# Patient Record
Sex: Female | Born: 1990 | Hispanic: Yes | Smoking: Never smoker
Health system: Southern US, Community
[De-identification: ages and names within clinical notes are randomized; demographics above are authoritative.]

## PROBLEM LIST (undated history)

## (undated) ENCOUNTER — Inpatient Hospital Stay (HOSPITAL_COMMUNITY): Payer: Self-pay

## (undated) DIAGNOSIS — O24419 Gestational diabetes mellitus in pregnancy, unspecified control: Secondary | ICD-10-CM

## (undated) DIAGNOSIS — Z789 Other specified health status: Secondary | ICD-10-CM

## (undated) DIAGNOSIS — K2970 Gastritis, unspecified, without bleeding: Secondary | ICD-10-CM

## (undated) HISTORY — PX: NO PAST SURGERIES: SHX2092

## (undated) HISTORY — DX: Other specified health status: Z78.9

---

## 2014-05-19 ENCOUNTER — Other Ambulatory Visit (HOSPITAL_COMMUNITY): Payer: Self-pay | Admitting: Urology

## 2014-05-19 DIAGNOSIS — Z3689 Encounter for other specified antenatal screening: Secondary | ICD-10-CM

## 2014-05-19 LAB — OB RESULTS CONSOLE VARICELLA ZOSTER ANTIBODY, IGG: Varicella: IMMUNE

## 2014-05-19 LAB — OB RESULTS CONSOLE ANTIBODY SCREEN: Antibody Screen: NEGATIVE

## 2014-05-19 LAB — OB RESULTS CONSOLE GC/CHLAMYDIA
CHLAMYDIA, DNA PROBE: NEGATIVE
GC PROBE AMP, GENITAL: NEGATIVE

## 2014-05-19 LAB — OB RESULTS CONSOLE PLATELET COUNT: PLATELETS: 241 10*3/uL

## 2014-05-19 LAB — OB RESULTS CONSOLE RPR
RPR: NONREACTIVE
RPR: NONREACTIVE

## 2014-05-19 LAB — OB RESULTS CONSOLE HEPATITIS B SURFACE ANTIGEN: Hepatitis B Surface Ag: NEGATIVE

## 2014-05-19 LAB — OB RESULTS CONSOLE HGB/HCT, BLOOD
HCT: 38 %
Hemoglobin: 12.6 g/dL

## 2014-05-19 LAB — OB RESULTS CONSOLE ABO/RH: RH Type: POSITIVE

## 2014-05-19 LAB — SICKLE CELL SCREEN: SICKLE CELL SCREEN: NEGATIVE

## 2014-05-19 LAB — GLUCOSE TOLERANCE, 1 HOUR: Glucose, GTT - 1 Hour: 89 mg/dL (ref ?–200)

## 2014-05-19 LAB — CYSTIC FIBROSIS DIAGNOSTIC STUDY: Interpretation-CFDNA:: NEGATIVE

## 2014-05-19 LAB — CYTOLOGY - PAP: Pap: NEGATIVE

## 2014-05-19 LAB — CULTURE, OB URINE: Urine Culture, OB: NEGATIVE

## 2014-05-19 LAB — OB RESULTS CONSOLE RUBELLA ANTIBODY, IGM: Rubella: IMMUNE

## 2014-05-19 LAB — OB RESULTS CONSOLE HIV ANTIBODY (ROUTINE TESTING): HIV: NONREACTIVE

## 2014-06-08 ENCOUNTER — Ambulatory Visit (HOSPITAL_COMMUNITY)
Admission: RE | Admit: 2014-06-08 | Discharge: 2014-06-08 | Disposition: A | Discharge status: Home / Self Care | Payer: Medicaid Other | Source: Ambulatory Visit | Attending: Urology | Admitting: Urology

## 2014-06-08 DIAGNOSIS — Z3689 Encounter for other specified antenatal screening: Secondary | ICD-10-CM | POA: Diagnosis present

## 2014-08-11 LAB — GLUCOSE TOLERANCE, 1 HOUR: Glucose, GTT - 1 Hour: 139 mg/dL (ref ?–200)

## 2014-08-11 LAB — OB RESULTS CONSOLE HGB/HCT, BLOOD
HCT: 34 %
HEMOGLOBIN: 11.1 g/dL

## 2014-08-18 LAB — GLUCOSE TOLERANCE, 3 HOURS
GLUCOSE 3 HOUR GTT: 169 mg/dL — AB (ref ?–140)
Glucose, GTT - 1 Hour: 143 mg/dL (ref ?–200)
Glucose, GTT - 2 Hour: 171 mg/dL — AB (ref ?–140)
Glucose, GTT - Fasting: 75 mg/dL — AB (ref 80–110)

## 2014-08-22 ENCOUNTER — Ambulatory Visit: Payer: Self-pay | Admitting: *Deleted

## 2014-08-22 ENCOUNTER — Encounter: Payer: Medicaid Other | Attending: Family Medicine | Admitting: *Deleted

## 2014-08-22 VITALS — Ht 61.75 in | Wt 154.6 lb

## 2014-08-22 DIAGNOSIS — E119 Type 2 diabetes mellitus without complications: Secondary | ICD-10-CM

## 2014-08-22 DIAGNOSIS — O24419 Gestational diabetes mellitus in pregnancy, unspecified control: Secondary | ICD-10-CM | POA: Insufficient documentation

## 2014-08-22 DIAGNOSIS — Z713 Dietary counseling and surveillance: Secondary | ICD-10-CM | POA: Insufficient documentation

## 2014-08-22 NOTE — Progress Notes (Signed)
  Patient was seen on 08/22/14 for Gestational Diabetes self-management . The following learning objectives were met by the patient :   States the definition of Gestational Diabetes  States when to check blood glucose levels  Demonstrates proper blood glucose monitoring techniques  States the effect of stress and exercise on blood glucose levels  Plan:  Consider  increasing your activity level by walking daily as tolerated Begin checking BG before breakfast and 1-2 hours after first bit of breakfast, lunch and dinner after  as directed by MD  Take medication  as directed by MD  Blood glucose monitor given: [employer] Lot # [id number] Exp: 2016/05/29  Patient instructed to monitor glucose levels: FBS: 60 - <90 2 hour: <120  Patient received the following handouts:  Nutrition Diabetes and Pregnancy (Spanish)  Patient will be seen for follow-up as needed.

## 2014-08-22 NOTE — Progress Notes (Signed)
Nutrition note: 1st visit consult & GDM diet education Pt is a newly diagnosed GDM pt. Pt has gained 9.6# @ 3875w5d, which is slightly < expected. Pt reports eating 3 meals & 2-4 snacks/d. Pt is taking a PNV. Pt reports no N/V or heartburn. NKFA Pt reports walking for 1-2 hrs/d. Pt received verbal & written education about GDM diet in Spanish via an interpreter.  Discussed wt gain goals of 15-25# or 0.6#/wk. Pt agrees to follow GDM diet with 3 meals & 3 snacks/d with proper CHO/ protein combination/d. Pt has WIC & plans to BF. F/u in 4-6 wks Blondell RevealLaura Chasey Dull, MS, RD, LDN, [facility]

## 2014-08-29 ENCOUNTER — Ambulatory Visit (INDEPENDENT_AMBULATORY_CARE_PROVIDER_SITE_OTHER): Payer: Medicaid Other | Admitting: Family Medicine

## 2014-08-29 ENCOUNTER — Encounter: Payer: Self-pay | Admitting: *Deleted

## 2014-08-29 ENCOUNTER — Encounter: Payer: Self-pay | Admitting: Family Medicine

## 2014-08-29 VITALS — BP 104/70 | HR 70 | Temp 97.7°F | Wt 154.3 lb

## 2014-08-29 DIAGNOSIS — O24419 Gestational diabetes mellitus in pregnancy, unspecified control: Secondary | ICD-10-CM

## 2014-08-29 DIAGNOSIS — O0993 Supervision of high risk pregnancy, unspecified, third trimester: Secondary | ICD-10-CM | POA: Insufficient documentation

## 2014-08-29 HISTORY — DX: Gestational diabetes mellitus in pregnancy, unspecified control: O24.419

## 2014-08-29 LAB — POCT URINALYSIS DIP (DEVICE)
BILIRUBIN URINE: NEGATIVE
Glucose, UA: NEGATIVE mg/dL
Hgb urine dipstick: NEGATIVE
KETONES UR: NEGATIVE mg/dL
NITRITE: NEGATIVE
Protein, ur: NEGATIVE mg/dL
Specific Gravity, Urine: 1.02 (ref 1.005–1.030)
Urobilinogen, UA: 0.2 mg/dL (ref 0.0–1.0)
pH: 5.5 (ref 5.0–8.0)

## 2014-08-29 MED ORDER — GLYBURIDE 2.5 MG PO TABS: 2.5000 mg | ORAL_TABLET | Freq: Every day | ORAL | Status: DC

## 2014-08-29 NOTE — Progress Notes (Signed)
Fasting CBG  6 out of 7 > 90 (87-115) 2 hr PP 6:11 elevated  Start glyburide 2.5mg  at night F/u 1 week

## 2014-08-29 NOTE — Progress Notes (Signed)
Alis used for interpreter  

## 2014-08-29 NOTE — Patient Instructions (Signed)
Tercer trimestre del embarazo  (Third Trimester of Pregnancy)  El tercer trimestre del embarazo abarca desde la semana 29 hasta la semana 42, desde el 7 mes hasta el 9. En este trimestre el beb (feto) se desarrolla muy rpidamente. Hacia el final del noveno mes, el beb que an no ha nacido mide alrededor de 20 pulgadas (45 cm) de largo. Y pesa entre 6 y 10 libras (2,700 y 4,500 kg).  CUIDADOS EN EL HOGAR   Evite fumar, consumir hierbas y beber alcohol. Evite los frmacos que no apruebe el mdico.  Slo tome los medicamentos que le haya indicado su mdico. Algunos medicamentos son seguros para tomar durante el embarazo y otros no lo son.  Haga ejercicios slo como le indique el mdico. Deje de hacer ejercicios si comienza a tener clicos.  Haga comidas regulares y sanas.  Use un sostn que le brinde buen soporte si sus mamas estn sensibles.  No utilice la baera con agua caliente, baos turcos y saunas.  Colquese el cinturn de seguridad cuando conduzca.  Evite comer carne cruda y el contacto con los utensilios y desperdicios de los gatos.  Tome las vitaminas indicadas para la etapa prenatal.  Trate de tomar medicamentos para mover el intestino (laxantes) segn lo necesario y si su mdico la autoriza. Consuma ms fibra comiendo frutas y vegetales frescos y granos enteros. Beba gran cantidad de lquido para mantener el pis (orina) de tono claro o amarillo plido.  Tome baos de agua tibia (baos de asiento) para calmar el dolor o las molestias causadas por las hemorroides. Use una crema para las hemorroides si el mdico la autoriza.  Si tiene venas hinchadas y abultadas (venas varicosas), use medias de soporte. Eleve (levante) los pies durante 15 minutos, 3 o 4 veces por da. Limite el consumo de sal en su dieta.  Evite levantar objetos pesados, usar tacones altos y sintese derecha.  Descanse con las piernas elevadas si tiene calambres o dolor de cintura.  Visite a su dentista  si no lo ha hecho durante el embarazo. Use un cepillo de dientes blando para higienizarse los dientes. Use suavemente el hilo dental.  Puede tener sexo (relaciones sexuales) siempre que el mdico la autorice.  No viaje por largas distancias si puede evitarlo. Slo hgalo con la aprobacin de su mdico.  Haga el curso pre parto.  Practique conducir hasta el hospital.  Prepare el bolso que llevar.  Prepare la habitacin del beb.  Concurra a los controles mdicos. SOLICITE AYUDA SI:   No est segura si est en trabajo de parto o ha roto la bolsa de aguas.  Tiene mareos.  Siente clicos intensos o presin en la zona baja del vientre (abdomen).  Siente un dolor persistente en la zona del vientre.  Tiene malestar estomacal (nuseas), devuelve (vomita), o tiene deposiciones acuosas (diarrea).  Advierte un olor ftido que proviene de la vagina.  Siente dolor al hacer pis (orinar). SOLICITE AYUDA DE INMEDIATO SI:   Tiene fiebre.  Pierde lquido o sangre por la vagina.  Tiene sangrando o pequeas prdidas vaginales.  Siente dolor intenso o clicos en el abdomen.  Sube o baja de peso rpidamente.  Tiene dificultad para respirar o siente dolor en el pecho.  Sbitamente se le hinchan el rostro, las manos, los tobillos, los pies o las piernas.  No ha sentido los movimientos del beb durante una hora.  Siente un dolor de cabeza intenso que no se alivia con medicamentos.  Su visin se modifica. Document   Released: 04/28/2013 [employer] Patient Information 2015 [employer]. This information is not intended to replace advice given to you by your health care provider. Make sure you discuss any questions you have with your health care provider.  

## 2014-09-05 ENCOUNTER — Encounter: Payer: Self-pay | Admitting: Family Medicine

## 2014-09-05 ENCOUNTER — Ambulatory Visit (INDEPENDENT_AMBULATORY_CARE_PROVIDER_SITE_OTHER): Payer: Self-pay | Admitting: Family Medicine

## 2014-09-05 VITALS — BP 102/63 | HR 65 | Wt 151.8 lb

## 2014-09-05 DIAGNOSIS — O24419 Gestational diabetes mellitus in pregnancy, unspecified control: Secondary | ICD-10-CM

## 2014-09-05 DIAGNOSIS — O0993 Supervision of high risk pregnancy, unspecified, third trimester: Secondary | ICD-10-CM

## 2014-09-05 LAB — POCT URINALYSIS DIP (DEVICE)
BILIRUBIN URINE: NEGATIVE
Glucose, UA: NEGATIVE mg/dL
HGB URINE DIPSTICK: NEGATIVE
Ketones, ur: NEGATIVE mg/dL
NITRITE: NEGATIVE
PH: 6 (ref 5.0–8.0)
PROTEIN: NEGATIVE mg/dL
Specific Gravity, Urine: 1.015 (ref 1.005–1.030)
Urobilinogen, UA: 0.2 mg/dL (ref 0.0–1.0)

## 2014-09-05 MED ORDER — GLYBURIDE 5 MG PO TABS: 5.0000 mg | ORAL_TABLET | Freq: Every day | ORAL | Status: DC

## 2014-09-05 NOTE — Progress Notes (Signed)
Spanish interpreter: Okey Regalarol used FBS 85-119 (still with fastings are out of range) 2 hour pp 86-123 (1 out of range) Increase nighttime Glyburide to 5 mg

## 2014-09-05 NOTE — Patient Instructions (Signed)
Diabetes mellitus gestacional (Gestational Diabetes Mellitus) La diabetes mellitus gestacional, ms comnmente conocida como diabetes gestacional es un tipo de diabetes que desarrollan algunas mujeres durante el embarazo. En la diabetes gestacional, el pncreas no produce suficiente insulina (una hormona) o las clulas son menos sensibles a la insulina producida (resistencia a la insulina), o ambas cosas. Normalmente, la insulina mueve los azcares de los alimentos a las clulas de los tejidos. Las clulas de los tejidos utilizan los azcares para obtener energa. La falta de insulina o la falta de una respuesta normal a la insulina hace que el exceso de azcar se acumule en la sangre en lugar de penetrar en las clulas de los tejidos. Como resultado, se producen niveles altos de azcar en la sangre (hiperglucemia). El efecto de los niveles altos de azcar (glucosa) puede causar muchos problemas.  FACTORES DE RIESGO Usted tiene mayor probabilidad de desarrollar diabetes gestacional si tiene antecedentes familiares de diabetes y tambin si tiene uno o ms de los siguientes factores de riesgo:  ndice de masa corporal superior a 30 (obesidad).  Embarazo previo con diabetes gestacional.  La edad avanzada en el momento del embarazo. Si se mantienen los niveles de glucosa en la sangre en un rango normal durante el embarazo, las mujeres pueden tener un embarazo saludable. Si los niveles de glucosa en la sangre no estn bien controlados, puede haber riesgos para usted, el feto o el recin nacido, o durante el trabajo de parto y el parto.  SNTOMAS  Si se presentan sntomas, stos son similares a los sntomas que normalmente experimentar durante el embarazo. Los sntomas de la diabetes gestacional son:   Aumento de la sed (polidipsia).  Aumento de la miccin (poliuria).  Orina con ms frecuencia durante la noche (nocturia).  Prdida de peso. La prdida de peso puede ser muy rpida.  Infecciones  frecuentes y recurrentes.  Cansancio (fatiga).  Debilidad.  Cambios en la visin, como visin borrosa.  Olor a fruta en el aliento.  Dolor abdominal. DIAGNSTICO La diabetes se diagnostica cuando hay aumento de los niveles de glucosa en la sangre. El nivel de glucosa en la sangre puede controlarse en uno o ms de los siguientes anlisis de sangre:  Medicin de glucosa en la sangre en ayunas. No se le permitir comer durante al menos 8 horas antes de que se tome una muestra de sangre.  Pruebas al azar de glucosa en la sangre. El nivel de glucosa en la sangre se controla en cualquier momento del da sin importar el momento en que haya comido.  Prueba de A1c (hemoglobina glucosilada) Una prueba de A1c proporciona informacin sobre el control de la glucosa en la sangre durante los ltimos 3 meses.  Prueba de tolerancia a la glucosa oral (PTGO). La glucosa en la sangre se mide despus de no haber comido (ayunas) durante una a tres horas y despus de beber una bebida que contenga glucosa. Dado que las hormonas que causan la resistencia a la insulina son ms altas alrededor de las semanas 24 a 28 de embarazo, generalmente se realiza una PTGO durante ese tiempo. Si tiene factores de riesgo de diabetes gestacional, su mdico puede hacerle estudios de deteccin antes de las 24semanas de embarazo. TRATAMIENTO   Usted tendr que tomar medicamentos para la diabetes o insulina diariamente para mantener los niveles de glucosa en la sangre en el rango deseado.  Usted tendr que combinar la dosis de insulina con la actividad fsica y la eleccin de alimentos saludables. El objetivo del   tratamiento es mantener el nivel de azcar en la sangre previo a comer (preprandial) y durante la noche entre 60 y 99mg/dl, durante todo el embarazo. El objetivo del tratamiento es mantener el nivel pico de azcar en la sangre despus de comer (glucosa posprandial) entre 100y 140mg/dl. INSTRUCCIONES PARA EL CUIDADO EN EL  HOGAR   Controle su nivel de hemoglobina A1c dos veces al ao.  Contrlese a diario el nivel de glucosa en la sangre segn las indicaciones de su mdico. Es comn realizar controles frecuentes de la glucosa en la sangre.  Supervise las cetonas en la orina cuando est enferma y segn las indicaciones de su mdico.  Tome el medicamento para la diabetes y adminstrese insulina segn las indicaciones de su mdico para mantener el nivel de glucosa en la sangre en el rango deseado.  Nunca se quede sin medicamento para la diabetes o sin insulina. Es necesario que la reciba todos los das.  Ajuste la insulina segn la ingesta de hidratos de carbono. Los hidratos de carbono pueden aumentar los niveles de glucosa en la sangre, pero deben incluirse en su dieta. Los hidratos de carbono aportan vitaminas, minerales y fibra que son una parte esencial de una dieta saludable. Los hidratos de carbono se encuentran en frutas, verduras, cereales integrales, productos lcteos, legumbres y alimentos que contienen azcares aadidos.  Consuma alimentos saludables. Alterne 3 comidas con 3 colaciones.  Aumente de peso saludablemente. El aumento del peso total vara de acuerdo con el ndice de masa corporal que tena antes del embarazo ([facility]).  Lleve una tarjeta de alerta mdica o use una pulsera o medalla de alerta mdica.  Lleve con usted una colacin de 15gramos de hidratos de carbono en todo momento para controlar los niveles bajos de glucosa en la sangre (hipoglucemia). Algunos ejemplos de colaciones de 15gramos de hidratos de carbono son los siguientes:  Tabletas de glucosa, 3 o 4.  Gel de glucosa, tubo de 15 gramos.  Pasas de uva, 2 cucharadas (24 g).  Caramelos de goma, 6.  Galletas de animales, 8.  Jugo de fruta, gaseosa comn, o leche descremada, 4 onzas (120 ml).  Pastillas de goma, 9.  Reconocer la hipoglucemia. Durante el embarazo la hipoglucemia se produce cuando hay niveles de glucosa en la  sangre de 60 mg/dl o menos. El riesgo de hipoglucemia aumenta durante el ayuno o cuando se saltea las comidas, durante o despus de realizar ejercicio intenso y mientras duerme. Los sntomas de hipoglucemia son:  Temblores o sacudidas.  Disminucin de la capacidad de concentracin.  Sudoracin.  Aumento de la frecuencia cardaca.  Dolor de cabeza.  Sequedad en la boca.  Hambre.  Irritabilidad.  Ansiedad.  Sueo agitado.  Alteracin del habla o de la coordinacin.  Confusin.  Tratar la hipoglucemia rpidamente. Si usted est alerta y puede tragar con seguridad, siga la regla de 15/15 que consiste en:  Tome entre 15 y 20gramos de glucosa de accin rpida o carbohidratos. Las opciones de accin rpida son un gel de glucosa, tabletas de glucosa, o 4 onzas (120 ml) de jugo de frutas, gaseosa comn, o leche baja en grasa.  Compruebe su nivel de glucosa en la sangre 15 minutos despus de tomar la glucosa.  Tome entre 15 y 20 gramos ms de glucosa si el nivel de glucosa en la sangre todava es de 70mg/dl o inferior.  Ingiera una comida o una colacin en el lapso de 1 hora una vez que los niveles de glucosa en la sangre vuelven   a la normalidad.  Est atento a la poliuria (miccin excesiva) y la polidipsia (sensacin de mucha sed), que son los primeros signos de la hiperglucemia. El reconocimiento temprano de la hiperglucemia permite un tratamiento oportuno. Trate la hiperglucemia segn le indic su mdico.  Haga actividad fsica por lo menos 30minutos al da o como lo indique su mdico. Se recomienda que 30 minutos despus de cada comida, realice diez minutos de actividad fsica para controlar los niveles de glucosa postprandial en la sangre.  Ajuste su dosis de insulina y la ingesta de alimentos, segn sea necesario, si inicia un nuevo ejercicio o deporte.  Siga su plan para los das de enfermedad cuando no pueda comer o beber como de costumbre.  Evite el tabaco y el  alcohol.  Concurra a todas las visitas de control como se lo haya indicado el mdico.  Siga el consejo del mdico respecto a los controles prenatales y posteriores al parto (postparto), las visitas, la planificacin de las comidas, el ejercicio, los medicamentos, las vitaminas, los anlisis de sangre, otras pruebas mdicas y actividades fsicas.  Realice diariamente el cuidado de la piel y de los pies. Examine su piel y los pies diariamente para ver si tiene cortes, moretones, enrojecimiento, problemas en las uas, sangrado, ampollas o llagas.  Cepllese los dientes y encas por lo menos dos veces al da y use hilo dental al menos una vez por da. Concurra regularmente a las visitas de control con el dentista.  Programe un examen de vista durante el primer trimestre de su embarazo o como lo indique su mdico.  Comparta su plan de control de diabetes en el trabajo o en la escuela.  Mantngase al da con las vacunas.  Aprenda a manejar el estrs.  Obtenga la mayor cantidad posible de informacin sobre la diabetes y solicite ayuda siempre que sea necesario.  Obtenga informacin sobre el amamantamiento y analice esta posibilidad.  Debe controlar el nivel de azcar en la sangre de 6a 12semanas despus del parto. Esto se hace con una prueba de tolerancia a la glucosa oral (PTGO). SOLICITE ATENCIN MDICA SI:   No puede comer alimentos o beber por ms de 6 horas.  Tuvo nuseas o ha vomitado durante ms de 6 horas.  Tiene un nivel de glucosa en la sangre de 200 mg/dl y cetonas en la orina.  Presenta algn cambio en el estado mental.  Desarrolla problemas de visin.  Sufre un dolor persistente de cabeza.  Siente dolor o molestias en la parte superior del abdomen.  Desarrolla una enfermedad grave adicional.  Tuvo diarrea durante ms de 6 horas.  Ha estado enfermo o ha tenido fiebre durante un par de das y no mejora. SOLICITE ATENCIN MDICA DE INMEDIATO SI:   Tiene dificultad  para respirar.  Ya no siente los movimientos del beb.  Est sangrando o tiene flujo vaginal.  Comienza a tener contracciones o trabajo de parto prematuro. ASEGRESE DE QUE:  Comprende estas instrucciones.  Controlar su afeccin.  Recibir ayuda de inmediato si no mejora o si empeora. Document Released: 06/05/2005 Document Revised: 01/10/2014 [employer] Patient Information 2015 [employer]. This information is not intended to replace advice given to you by your health care provider. Make sure you discuss any questions you have with your health care provider.  Lactancia materna (Breastfeeding) Decidir amamantar es una de las mejores elecciones que puede hacer por usted y su beb. El cambio hormonal durante el embarazo produce el desarrollo del tejido mamario y aumenta la cantidad   y el tamao de los conductos galactforos. Estas hormonas tambin permiten que las protenas, los azcares y las grasas de la sangre produzcan la leche materna en las glndulas productoras de leche. Las hormonas impiden que la leche materna sea liberada antes del nacimiento del beb, adems de impulsar el flujo de leche luego del nacimiento. Una vez que ha comenzado a amamantar, pensar en el beb, as como la succin o el llanto, pueden estimular la liberacin de leche de las glndulas productoras de leche.  LOS BENEFICIOS DE AMAMANTAR Para el beb  La primera leche (calostro) ayuda a mejorar el funcionamiento del sistema digestivo del beb.  La leche tiene anticuerpos que ayudan a prevenir las infecciones en el beb.  El beb tiene una menor incidencia de asma, alergias y del sndrome de muerte sbita del lactante.  Los nutrientes en la leche materna son mejores para el beb que la leche maternizada y estn preparados exclusivamente para cubrir las necesidades del beb.  La leche materna mejora el desarrollo cerebral del beb.  Es menos probable que el beb desarrolle otras enfermedades, como obesidad  infantil, asma o diabetes mellitus de tipo 2. Para usted   La lactancia materna favorece el desarrollo de un vnculo muy especial entre la madre y el beb.  Es conveniente. La leche materna siempre est disponible a la temperatura correcta y es econmica.  La lactancia materna ayuda a quemar caloras y a perder el peso ganado durante el embarazo.  Favorece la contraccin del tero al tamao que tena antes del embarazo de manera ms rpida y disminuye el sangrado (loquios) despus del parto.  La lactancia materna contribuye a reducir el riesgo de desarrollar diabetes mellitus de tipo 2, osteoporosis o cncer de mama o de ovario en el futuro. SIGNOS DE QUE EL BEB EST HAMBRIENTO Primeros signos de hambre  Aumenta su estado de alerta o actividad.  Se estira.  Mueve la cabeza de un lado a otro.  Mueve la cabeza y abre la boca cuando se le toca la mejilla o la comisura de la boca (reflejo de bsqueda).  Aumenta las vocalizaciones, tales como sonidos de succin, se relame los labios, emite arrullos, suspiros, o chirridos.  Mueve la mano hacia la boca.  Se chupa con ganas los dedos o las manos. Signos tardos de hambre  Est agitado.  Llora de manera intermitente. Signos de hambre extrema Los signos de hambre extrema requerirn que lo calme y lo consuele antes de que el beb pueda alimentarse adecuadamente. No espere a que se manifiesten los siguientes signos de hambre extrema para comenzar a amamantar:   Agitacin.  Llanto intenso y fuerte.   Gritos. INFORMACIN BSICA SOBRE LA LACTANCIA MATERNA Iniciacin de la lactancia materna  Encuentre un lugar cmodo para sentarse o acostarse, con un buen respaldo para el cuello y la espalda.  Coloque una almohada o una manta enrollada debajo del beb para acomodarlo a la altura de la mama (si est sentada). Las almohadas para amamantar se han diseado especialmente a fin de servir de apoyo para los brazos y el beb mientras  amamanta.  Asegrese de que el abdomen del beb est frente al suyo.  Masajee suavemente la mama. Con las yemas de los dedos, masajee la pared del pecho hacia el pezn en un movimiento circular. Esto estimula el flujo de leche. Es posible que deba continuar este movimiento mientras amamanta si la leche fluye lentamente.  Sostenga la mama con el pulgar por arriba del pezn y los otros   4 dedos por debajo de la mama. Asegrese de que los dedos se encuentren lejos del pezn y de la boca del beb.  Empuje suavemente los labios del beb con el pezn o con el dedo.  Cuando la boca del beb se abra lo suficiente, acrquelo rpidamente a la mama e introduzca todo el pezn y la zona oscura que lo rodea (areola), tanto como sea posible, dentro de la boca del beb.  Debe haber ms areola visible por arriba del labio superior del beb que por debajo del labio inferior.  La lengua del beb debe estar entre la enca inferior y la mama.  Asegrese de que la boca del beb est en la posicin correcta alrededor del pezn (prendida). Los labios del beb deben crear un sello sobre la mama y estar doblados hacia afuera (invertidos).  Es comn que el beb succione durante 2 a 3 minutos para que comience el flujo de leche materna. Cmo debe prenderse Es muy importante que le ensee al beb cmo prenderse adecuadamente a la mama. Si el beb no se prende adecuadamente, puede causarle dolor en el pezn y reducir la produccin de leche materna, y hacer que el beb tenga un escaso aumento de peso. Adems, si el beb no se prende adecuadamente al pezn, puede tragar aire durante la alimentacin. Esto puede causarle molestias al beb. Hacer eructar al beb al cambiar de mama puede ayudarlo a liberar el aire. Sin embargo, ensearle al beb cmo prenderse a la mama adecuadamente es la mejor manera de evitar que se sienta molesto por tragar aire mientras se alimenta. Signos de que el beb se ha prendido adecuadamente al pezn:    Tironea o succiona de modo silencioso, sin causarle dolor.  Se escucha que traga cada 3 o 4 succiones.   Hay movimientos musculares por arriba y por delante de sus odos al succionar. Signos de que el beb no se ha prendido adecuadamente al pezn:   Hace ruidos de succin o de chasquido mientras se alimenta.  Siente dolor en el pezn. Si cree que el beb no se prendi correctamente, deslice el dedo en la comisura de la boca y colquelo entre las encas del beb para interrumpir la succin. Intente comenzar a amamantar nuevamente. Signos de lactancia materna exitosa Signos del beb:   Disminuye gradualmente el nmero de succiones o cesa la succin por completo.  Se duerme.  Relaja el cuerpo.  Retiene una pequea cantidad de leche en la boca.  Se desprende solo del pecho. Signos que presenta usted:  Las mamas han aumentado la firmeza, el peso y el tamao 1 a 3 horas despus de amamantar.  Estn ms blandas inmediatamente despus de amamantar.  Un aumento del volumen de leche, y tambin un cambio en su consistencia y color se producen hacia el quinto da de lactancia materna.  Los pezones no duelen, ni estn agrietados ni sangran. Signos de que su beb recibe la cantidad de leche suficiente  Moja al menos 3 paales en 24 horas. La orina debe ser clara y de color amarillo plido a los 5 das de vida.  Defeca al menos 3 veces en 24 horas a los 5 das de vida. La materia fecal debe ser blanda y amarillenta.  Defeca al menos 3 veces en 24 horas a los 7 das de vida. La materia fecal debe ser grumosa y amarillenta.  No registra una prdida de peso mayor del 10% del peso al nacer durante los primeros 3 das de vida.    Aumenta de peso un promedio de 4 a 7onzas (113 a 198g) por semana despus de los 4 das de vida.  Aumenta de peso, diariamente, de manera uniforme a partir de los 5 das de vida, sin registrar prdida de peso despus de las 2semanas de vida. Despus de  alimentarse, es posible que el beb regurgite una pequea cantidad. Esto es frecuente. FRECUENCIA Y DURACIN DE LA LACTANCIA MATERNA El amamantamiento frecuente la ayudar a producir ms leche y a prevenir problemas de dolor en los pezones e hinchazn en las mamas. Alimente al beb cuando muestre signos de hambre o si siente la necesidad de reducir la congestin de las mamas. Esto se denomina "lactancia a demanda". Evite el uso del chupete mientras trabaja para establecer la lactancia (las primeras 4 a 6 semanas despus del nacimiento del beb). Despus de este perodo, podr ofrecerle un chupete. Las investigaciones demostraron que el uso del chupete durante el primer ao de vida del beb disminuye el riesgo de desarrollar el sndrome de muerte sbita del lactante (SMSL). Permita que el nio se alimente en cada mama todo lo que desee. Contine amamantando al beb hasta que haya terminado de alimentarse. Cuando el beb se desprende o se queda dormido mientras se est alimentando de la primera mama, ofrzcale la segunda. Debido a que, con frecuencia, los recin nacidos permanecen somnolientos las primeras semanas de vida, es posible que deba despertar al beb para alimentarlo. Los horarios de lactancia varan de un beb a otro. Sin embargo, las siguientes reglas pueden servir como gua para ayudarla a garantizar que el beb se alimenta adecuadamente:  Se puede amamantar a los recin nacidos (bebs de 4 semanas o menos de vida) cada 1 a 3 horas.  No deben transcurrir ms de 3 horas durante el da o 5 horas durante la noche sin que se amamante a los recin nacidos.  Debe amamantar al beb 8 veces como mnimo en un perodo de 24 horas, hasta que comience a introducir slidos en su dieta, a los 6 meses de vida aproximadamente. EXTRACCIN DE LECHE MATERNA La extraccin y el almacenamiento de la leche materna le permiten asegurarse de que el beb se alimente exclusivamente de leche materna, aun en momentos en  los que no puede amamantar. Esto tiene especial importancia si debe regresar al trabajo en el perodo en que an est amamantando o si no puede estar presente en los momentos en que el beb debe alimentarse. Su asesor en lactancia puede orientarla sobre cunto tiempo es seguro almacenar leche materna.  El sacaleche es un aparato que le permite extraer leche de la mama a un recipiente estril. Luego, la leche materna extrada puede almacenarse en un refrigerador o congelador. Algunos sacaleches son manuales, mientras que otros son elctricos. Consulte a su asesor en lactancia qu tipo ser ms conveniente para usted. Los sacaleches se pueden comprar; sin embargo, algunos hospitales y grupos de apoyo a la lactancia materna alquilan sacaleches mensualmente. Un asesor en lactancia puede ensearle cmo extraer leche materna manualmente, en caso de que prefiera no usar un sacaleche.  CMO CUIDAR LAS MAMAS DURANTE LA LACTANCIA MATERNA Los pezones se secan, agrietan y duelen durante la lactancia materna. Las siguientes recomendaciones pueden ayudarla a mantener las mamas humectadas y sanas:  Evite usar jabn en los pezones.  Use un sostn de soporte. Aunque no son esenciales, las camisetas sin mangas o los sostenes especiales para amamantar estn diseados para acceder fcilmente a las mamas, para amamantar sin tener que quitarse todo   el sostn o la camiseta. Evite usar sostenes con aro o sostenes muy ajustados.  Seque al aire sus pezones durante 3 a 4minutos despus de amamantar al beb.  Utilice solo apsitos de algodn en el sostn para absorber las prdidas de leche. La prdida de un poco de leche materna entre las tomas es normal.  Utilice lanolina sobre los pezones luego de amamantar. La lanolina ayuda a mantener la humedad normal de la piel. Si usa lanolina pura, no tiene que lavarse los pezones antes de volver a alimentar al beb. La lanolina pura no es txica para el beb. Adems, puede extraer  manualmente algunas gotas de leche materna y masajear suavemente esa leche sobre los pezones, para que la leche se seque al aire. Durante las primeras semanas despus de dar a luz, algunas mujeres pueden experimentar hinchazn en las mamas (congestin mamaria). La congestin puede hacer que sienta las mamas pesadas, calientes y sensibles al tacto. El pico de la congestin ocurre dentro de los 3 a 5 das despus del parto. Las siguientes recomendaciones pueden ayudarla a aliviar la congestin:  Vace por completo las mamas al amamantar o extraer leche. Puede aplicar calor hmedo en las mamas (en la ducha o con toallas hmedas para manos) antes de amamantar o extraer leche. Esto aumenta la circulacin y ayuda a que la leche fluya. Si el beb no vaca por completo las mamas cuando lo amamanta, extraiga la leche restante despus de que haya finalizado.  Use un sostn ajustado (para amamantar o comn) o una camiseta sin mangas durante 1 o 2 das para indicar al cuerpo que disminuya ligeramente la produccin de leche.  Aplique compresas de hielo sobre las mamas, a menos que le resulte demasiado incmodo.  Asegrese de que el beb est prendido y se encuentre en la posicin correcta mientras lo alimenta. Si la congestin persiste luego de 48 horas o despus de seguir estas recomendaciones, comunquese con su mdico o un asesor en lactancia. RECOMENDACIONES GENERALES PARA EL CUIDADO DE LA SALUD DURANTE LA LACTANCIA MATERNA  Consuma alimentos saludables. Alterne comidas y colaciones, y coma 3 de cada una por da. Dado que lo que come afecta la leche materna, es posible que algunas comidas hagan que su beb se vuelva ms irritable de lo habitual. Evite comer este tipo de alimentos si percibe que afectan de manera negativa al beb.  Beba leche, jugos de fruta y agua para satisfacer su sed (aproximadamente 10 vasos al da).  Descanse con frecuencia, reljese y tome sus vitaminas prenatales para evitar la  fatiga, el estrs y la anemia.  Contine con los autocontroles de la mama.  Evite masticar y fumar tabaco.  Evite el consumo de alcohol y drogas. Algunos medicamentos, que pueden ser perjudiciales para el beb, pueden pasar a travs de la leche materna. Es importante que consulte a su mdico antes de tomar cualquier medicamento, incluidos todos los medicamentos recetados y de venta libre, as como los suplementos vitamnicos y herbales. Puede quedar embarazada durante la lactancia. Si desea controlar la natalidad, consulte a su mdico cules son las opciones ms seguras para el beb. SOLICITE ATENCIN MDICA SI:   Usted siente que quiere dejar de amamantar o se siente frustrada con la lactancia.  Siente dolor en las mamas o en los pezones.  Sus pezones estn agrietados o sangran.  Sus pechos estn irritados, sensibles o calientes.  Tiene un rea hinchada en cualquiera de las mamas.  Siente escalofros o fiebre.  Tiene nuseas o vmitos.    Presenta una secrecin de otro lquido distinto de la leche materna de los pezones.  Sus mamas no se llenan antes de amamantar al beb para el quinto da despus del parto.  Se siente triste y deprimida.  El beb est demasiado somnoliento como para comer bien.  El beb tiene problemas para dormir.  Moja menos de 3 paales en 24 horas.  Defeca menos de 3 veces en 24 horas.  La piel del beb o la parte blanca de los ojos se vuelven amarillentas.  El beb no ha aumentado de peso a los 5 das de vida. SOLICITE ATENCIN MDICA DE INMEDIATO SI:   El beb est muy cansado (letargo) y no se quiere despertar para comer.  Le sube la fiebre sin causa. Document Released: 08/26/2005 Document Revised: 08/31/2013 ExitCare Patient Information 2015 [employer]. This information is not intended to replace advice given to you by your health care provider. Make sure you discuss any questions you have with your health care provider.  

## 2014-09-09 NOTE — L&D Delivery Note (Addendum)
Patient is 24 y.o. G1P1000 6713w1d admitted in active labor with hx of A2DM on glyburide, inadequate GBS ppx 2/2 precipitous delivery, first/only dose of PCN @ 1000   Delivery Note At 1:40 PM a viable female was delivered via Vaginal, Spontaneous Delivery (Presentation: Right Occiput Anterior).  APGAR: 8, 8; weight  pending  Placenta status: Intact, Spontaneous.  Cord: 3 vessels with the following complications: None.  Anesthesia: None  Episiotomy: None Lacerations: bilateral Labial, no 1st degree Suture Repair: 4.0 monocryl Est. Blood Loss (mL): 375mL  Mom to postpartum.  Baby to Couplet care / Skin to Skin.  Anna Wang 10/20/2014, 3:21 PM

## 2014-09-12 ENCOUNTER — Ambulatory Visit (INDEPENDENT_AMBULATORY_CARE_PROVIDER_SITE_OTHER): Payer: Self-pay | Admitting: Obstetrics & Gynecology

## 2014-09-12 VITALS — BP 105/60 | HR 68 | Temp 98.1°F | Wt 154.5 lb

## 2014-09-12 DIAGNOSIS — O24419 Gestational diabetes mellitus in pregnancy, unspecified control: Secondary | ICD-10-CM

## 2014-09-12 DIAGNOSIS — O0993 Supervision of high risk pregnancy, unspecified, third trimester: Secondary | ICD-10-CM

## 2014-09-12 LAB — POCT URINALYSIS DIP (DEVICE)
Bilirubin Urine: NEGATIVE
Glucose, UA: 100 mg/dL — AB
HGB URINE DIPSTICK: NEGATIVE
KETONES UR: NEGATIVE mg/dL
NITRITE: NEGATIVE
Protein, ur: NEGATIVE mg/dL
SPECIFIC GRAVITY, URINE: 1.02 (ref 1.005–1.030)
Urobilinogen, UA: 0.2 mg/dL (ref 0.0–1.0)
pH: 6.5 (ref 5.0–8.0)

## 2014-09-12 MED ORDER — GLYBURIDE 2.5 MG PO TABS: 2.5000 mg | ORAL_TABLET | Freq: Every day | ORAL | Status: DC

## 2014-09-12 NOTE — Progress Notes (Signed)
Pt increased dose of glyburide as instructed last week. She became dizzy the next day and this happened for 3 days in a row. She returned to glyburide 2.5 mg daily @ hs on 12/31. Upon review, she is not following the diet properly in terms of amount of food she is eating and also the schedule of eating.  Will need to meet with DE or Nutritionist for review today.

## 2014-09-12 NOTE — Progress Notes (Signed)
Nutrition note: f/u re: GDM diet Pt has gained 9.5# @ [redacted]w[redacted]d, which is still < expected. Pt report eating 3 meals & 1-2 snacks/d. Pt is taking a PNV. Pt's BS recently are- fasting: 70-81; 2 hr pp: 82-119. Pt reports feeling dizzy often. Reviewed GDM diet & encouraged pt to ensure she is eating 3 CHO servings with her lunch & dinner as to help her not feel as dizzy. Pt reports understanding. F/u as needed Blondell Reveal, MS, RD, LDN, [facility]

## 2014-09-12 NOTE — Progress Notes (Signed)
NST today reactive. Did not tolerate 5 mg glyburide HS, back to 2.5 mg All in range. Will review with DM educator

## 2014-09-12 NOTE — Patient Instructions (Signed)
Tercer trimestre de embarazo (Third Trimester of Pregnancy) El tercer trimestre va desde la semana29 hasta la 42, desde el sptimo hasta el noveno mes, y es la poca en la que el feto crece ms rpidamente. Hacia el final del noveno mes, el feto mide alrededor de 20pulgadas (45cm) de largo y pesa entre 6 y 10 libras (2,700 y 4,500kg).  CAMBIOS EN EL ORGANISMO Su organismo atraviesa por muchos cambios durante el embarazo, y estos varan de una mujer a otra.   Seguir aumentando de peso. Es de esperar que aumente entre 25 y 35libras (11 y 16kg) hacia el final del embarazo.  Podrn aparecer las primeras estras en las caderas, el abdomen y las mamas.  Puede tener necesidad de orinar con ms frecuencia porque el feto baja hacia la pelvis y ejerce presin sobre la vejiga.  Debido al embarazo podr sentir acidez estomacal con frecuencia.  Puede estar estreida, ya que ciertas hormonas enlentecen los movimientos de los msculos que empujan los desechos a travs de los intestinos.  Pueden aparecer hemorroides o abultarse e hincharse las venas (venas varicosas).  Puede sentir dolor plvico debido al aumento de peso y a que las hormonas del embarazo relajan las articulaciones entre los huesos de la pelvis. El dolor de espalda puede ser consecuencia de la sobrecarga de los msculos que soportan la postura.  Tal vez haya cambios en el cabello que pueden incluir su engrosamiento, crecimiento rpido y cambios en la textura. Adems, a algunas mujeres se les cae el cabello durante o despus del embarazo, o tienen el cabello seco o fino. Lo ms probable es que el cabello se le normalice despus del nacimiento del beb.  Las mamas seguirn creciendo y le dolern. A veces, puede haber una secrecin amarilla de las mamas llamada calostro.  El ombligo puede salir hacia afuera.  Puede sentir que le falta el aire debido a que se expande el tero.  Puede notar que el feto "baja" o lo siente ms bajo, en el  abdomen.  Puede tener una prdida de secrecin mucosa con sangre. Esto suele ocurrir en el trmino de unos pocos das a una semana antes de que comience el trabajo de parto.  El cuello del tero se vuelve delgado y blando (se borra) cerca de la fecha de parto. QU DEBE ESPERAR EN LOS EXMENES PRENATALES  Le harn exmenes prenatales cada 2semanas hasta la semana36. A partir de ese momento le harn exmenes semanales. Durante una visita prenatal de rutina:  La pesarn para asegurarse de que usted y el feto estn creciendo normalmente.  Le tomarn la presin arterial.  Le medirn el abdomen para controlar el desarrollo del beb.  Se escucharn los latidos cardacos fetales.  Se evaluarn los resultados de los estudios solicitados en visitas anteriores.  Le revisarn el cuello del tero cuando est prxima la fecha de parto para controlar si este se ha borrado. Alrededor de la semana36, el mdico le revisar el cuello del tero. Al mismo tiempo, realizar un anlisis de las secreciones del tejido vaginal. Este examen es para determinar si hay un tipo de bacteria, estreptococo Grupo B. El mdico le explicar esto con ms detalle. El mdico puede preguntarle lo siguiente:  Cmo le gustara que fuera el parto.  Cmo se siente.  Si siente los movimientos del beb.  Si ha tenido sntomas anormales, como prdida de lquido, sangrado, dolores de cabeza intensos o clicos abdominales.  Si tiene alguna pregunta. Otros exmenes o estudios de deteccin que pueden realizarse   durante el tercer trimestre incluyen lo siguiente:  Anlisis de sangre para controlar las concentraciones de hierro (anemia).  Controles fetales para determinar su salud, nivel de actividad y crecimiento. Si tiene alguna enfermedad o hay problemas durante el embarazo, le harn estudios. FALSO TRABAJO DE PARTO Es posible que sienta contracciones leves e irregulares que finalmente desaparecen. Se llaman contracciones de  Braxton Hicks o falso trabajo de parto. Las contracciones pueden durar horas, das o incluso semanas, antes de que el verdadero trabajo de parto se inicie. Si las contracciones ocurren a intervalos regulares, se intensifican o se hacen dolorosas, lo mejor es que la revise el mdico.  SIGNOS DE TRABAJO DE PARTO   Clicos de tipo menstrual.  Contracciones cada 5minutos o menos.  Contracciones que comienzan en la parte superior del tero y se extienden hacia abajo, a la zona inferior del abdomen y la espalda.  Sensacin de mayor presin en la pelvis o dolor de espalda.  Una secrecin de mucosidad acuosa o con sangre que sale de la vagina. Si tiene alguno de estos signos antes de la semana37 del embarazo, llame a su mdico de inmediato. Debe concurrir al hospital para que la controlen inmediatamente. INSTRUCCIONES PARA EL CUIDADO EN EL HOGAR   Evite fumar, consumir hierbas, beber alcohol y tomar frmacos que no le hayan recetado. Estas sustancias qumicas afectan la formacin y el desarrollo del beb.  Siga las indicaciones del mdico en relacin con el uso de medicamentos. Durante el embarazo, hay medicamentos que son seguros de tomar y otros que no.  Haga actividad fsica solo en la forma indicada por el mdico. Sentir clicos uterinos es un buen signo para detener la actividad fsica.  Contine comiendo alimentos que sanos con regularidad.  Use un sostn que le brinde buen soporte si le duelen las mamas.  No se d baos de inmersin en agua caliente, baos turcos ni saunas.  Colquese el cinturn de seguridad cuando conduzca.  No coma carne cruda ni queso sin cocinar; evite el contacto con las bandejas sanitarias de los gatos y la tierra que estos animales usan. Estos elementos contienen grmenes que pueden causar defectos congnitos en el beb.  Tome las vitaminas prenatales.  Si est estreida, pruebe un laxante suave (si el mdico lo autoriza). Consuma ms alimentos ricos en  fibra, como vegetales y frutas frescos y cereales integrales. Beba gran cantidad de lquido para mantener la orina de tono claro o color amarillo plido.  Dese baos de asiento con agua tibia para aliviar el dolor o las molestias causadas por las hemorroides. Use una crema para las hemorroides si el mdico la autoriza.  Si tiene venas varicosas, use medias de descanso. Eleve los pies durante 15minutos, 3 o 4veces por da. Limite la cantidad de sal en su dieta.  Evite levantar objetos pesados, use zapatos de tacones bajos y mantenga una buena postura.  Descanse con las piernas elevadas si tiene calambres o dolor de cintura.  Visite a su dentista si no lo ha hecho durante el embarazo. Use un cepillo de dientes blando para higienizarse los dientes y psese el hilo dental con suavidad.  Puede seguir manteniendo relaciones sexuales, a menos que el mdico le indique lo contrario.  No haga viajes largos excepto que sea absolutamente necesario y solo con la autorizacin del mdico.  Tome clases prenatales para entender, practicar y hacer preguntas sobre el trabajo de parto y el parto.  Haga un ensayo de la partida al hospital.  Prepare el bolso que   llevar al hospital.  Prepare la habitacin del beb.  Concurra a todas las visitas prenatales segn las indicaciones de su mdico. SOLICITE ATENCIN MDICA SI:  No est segura de que est en trabajo de parto o de que ha roto la bolsa de las aguas.  Tiene mareos.  Siente clicos leves, presin en la pelvis o dolor persistente en el abdomen.  Tiene nuseas, vmitos o diarrea persistentes.  Tiene secrecin vaginal con mal olor.  Siente dolor al orinar. SOLICITE ATENCIN MDICA DE INMEDIATO SI:   Tiene fiebre.  Tiene una prdida de lquido por la vagina.  Tiene sangrado o pequeas prdidas vaginales.  Siente dolor intenso o clicos en el abdomen.  Sube o baja de peso rpidamente.  Tiene dificultad para respirar y siente dolor de  pecho.  Sbitamente se le hinchan mucho el rostro, las manos, los tobillos, los pies o las piernas.  No ha sentido los movimientos del beb durante una hora.  Siente un dolor de cabeza intenso que no se alivia con medicamentos.  Hay cambios en la visin. Document Released: 06/05/2005 Document Revised: 08/31/2013 ExitCare Patient Information 2015 [employer]. This information is not intended to replace advice given to you by your health care provider. Make sure you discuss any questions you have with your health care provider.  

## 2014-09-12 NOTE — Progress Notes (Signed)
Anna Wang used for interpreter; patient states that when she started taking the  pills it made her feel dizzy and nauseous, so she is only taking 2.5mg  and she feels better

## 2014-09-15 ENCOUNTER — Ambulatory Visit (INDEPENDENT_AMBULATORY_CARE_PROVIDER_SITE_OTHER): Payer: Self-pay | Admitting: *Deleted

## 2014-09-15 VITALS — BP 97/59 | HR 66

## 2014-09-15 DIAGNOSIS — O24419 Gestational diabetes mellitus in pregnancy, unspecified control: Secondary | ICD-10-CM

## 2014-09-15 LAB — US OB FOLLOW UP

## 2014-09-15 NOTE — Progress Notes (Signed)
1/7 NST reviewed and reactive 

## 2014-09-15 NOTE — Progress Notes (Signed)
Anna Wang - interpreter present for visit.

## 2014-09-19 ENCOUNTER — Ambulatory Visit (INDEPENDENT_AMBULATORY_CARE_PROVIDER_SITE_OTHER): Payer: Self-pay | Admitting: Family Medicine

## 2014-09-19 VITALS — BP 94/58 | HR 64 | Temp 98.1°F | Wt 152.5 lb

## 2014-09-19 DIAGNOSIS — O24415 Gestational diabetes mellitus in pregnancy, controlled by oral hypoglycemic drugs: Secondary | ICD-10-CM

## 2014-09-19 DIAGNOSIS — O0993 Supervision of high risk pregnancy, unspecified, third trimester: Secondary | ICD-10-CM

## 2014-09-19 DIAGNOSIS — O24419 Gestational diabetes mellitus in pregnancy, unspecified control: Secondary | ICD-10-CM

## 2014-09-19 MED ORDER — GLYBURIDE 2.5 MG PO TABS: 2.5000 mg | ORAL_TABLET | Freq: Every day | ORAL | Status: DC

## 2014-09-19 NOTE — Progress Notes (Signed)
Spanish interpreter: Elane FritzBlanca used FBS 70-99 (2 out of range in 2 wks) 2 hour pp 77-130 (1 out of range)  NST reviewed and reactive.

## 2014-09-19 NOTE — Progress Notes (Signed)
[name] Anna HongMariaElena Wang present as interpreter for encounter.

## 2014-09-19 NOTE — Progress Notes (Signed)
Reports a lot of pelvic pressure at times and groin pain  Okey RegalCarol used for interpreter

## 2014-09-19 NOTE — Patient Instructions (Signed)
Diabetes mellitus gestacional (Gestational Diabetes Mellitus) La diabetes mellitus gestacional, ms comnmente conocida como diabetes gestacional es un tipo de diabetes que desarrollan algunas mujeres durante el embarazo. En la diabetes gestacional, el pncreas no produce suficiente insulina (una hormona) o las clulas son menos sensibles a la insulina producida (resistencia a la insulina), o ambas cosas. Normalmente, la insulina mueve los azcares de los alimentos a las clulas de los tejidos. Las clulas de los tejidos utilizan los azcares para obtener energa. La falta de insulina o la falta de una respuesta normal a la insulina hace que el exceso de azcar se acumule en la sangre en lugar de penetrar en las clulas de los tejidos. Como resultado, se producen niveles altos de azcar en la sangre (hiperglucemia). El efecto de los niveles altos de azcar (glucosa) puede causar muchos problemas.  FACTORES DE RIESGO Usted tiene mayor probabilidad de desarrollar diabetes gestacional si tiene antecedentes familiares de diabetes y tambin si tiene uno o ms de los siguientes factores de riesgo:  ndice de masa corporal superior a 30 (obesidad).  Embarazo previo con diabetes gestacional.  La edad avanzada en el momento del embarazo. Si se mantienen los niveles de glucosa en la sangre en un rango normal durante el embarazo, las mujeres pueden tener un embarazo saludable. Si los niveles de glucosa en la sangre no estn bien controlados, puede haber riesgos para usted, el feto o el recin nacido, o durante el trabajo de parto y el parto.  SNTOMAS  Si se presentan sntomas, stos son similares a los sntomas que normalmente experimentar durante el embarazo. Los sntomas de la diabetes gestacional son:   Aumento de la sed (polidipsia).  Aumento de la miccin (poliuria).  Orina con ms frecuencia durante la noche (nocturia).  Prdida de peso. La prdida de peso puede ser muy rpida.  Infecciones  frecuentes y recurrentes.  Cansancio (fatiga).  Debilidad.  Cambios en la visin, como visin borrosa.  Olor a fruta en el aliento.  Dolor abdominal. DIAGNSTICO La diabetes se diagnostica cuando hay aumento de los niveles de glucosa en la sangre. El nivel de glucosa en la sangre puede controlarse en uno o ms de los siguientes anlisis de sangre:  Medicin de glucosa en la sangre en ayunas. No se le permitir comer durante al menos 8 horas antes de que se tome una muestra de sangre.  Pruebas al azar de glucosa en la sangre. El nivel de glucosa en la sangre se controla en cualquier momento del da sin importar el momento en que haya comido.  Prueba de A1c (hemoglobina glucosilada) Una prueba de A1c proporciona informacin sobre el control de la glucosa en la sangre durante los ltimos 3 meses.  Prueba de tolerancia a la glucosa oral (PTGO). La glucosa en la sangre se mide despus de no haber comido (ayunas) durante una a tres horas y despus de beber una bebida que contenga glucosa. Dado que las hormonas que causan la resistencia a la insulina son ms altas alrededor de las semanas 24 a 28 de embarazo, generalmente se realiza una PTGO durante ese tiempo. Si tiene factores de riesgo de diabetes gestacional, su mdico puede hacerle estudios de deteccin antes de las 24semanas de embarazo. TRATAMIENTO   Usted tendr que tomar medicamentos para la diabetes o insulina diariamente para mantener los niveles de glucosa en la sangre en el rango deseado.  Usted tendr que combinar la dosis de insulina con la actividad fsica y la eleccin de alimentos saludables. El objetivo del   tratamiento es mantener el nivel de azcar en la sangre previo a comer (preprandial) y durante la noche entre 60 y 99mg/dl, durante todo el embarazo. El objetivo del tratamiento es mantener el nivel pico de azcar en la sangre despus de comer (glucosa posprandial) entre 100y 140mg/dl. INSTRUCCIONES PARA EL CUIDADO EN EL  HOGAR   Controle su nivel de hemoglobina A1c dos veces al ao.  Contrlese a diario el nivel de glucosa en la sangre segn las indicaciones de su mdico. Es comn realizar controles frecuentes de la glucosa en la sangre.  Supervise las cetonas en la orina cuando est enferma y segn las indicaciones de su mdico.  Tome el medicamento para la diabetes y adminstrese insulina segn las indicaciones de su mdico para mantener el nivel de glucosa en la sangre en el rango deseado.  Nunca se quede sin medicamento para la diabetes o sin insulina. Es necesario que la reciba todos los das.  Ajuste la insulina segn la ingesta de hidratos de carbono. Los hidratos de carbono pueden aumentar los niveles de glucosa en la sangre, pero deben incluirse en su dieta. Los hidratos de carbono aportan vitaminas, minerales y fibra que son una parte esencial de una dieta saludable. Los hidratos de carbono se encuentran en frutas, verduras, cereales integrales, productos lcteos, legumbres y alimentos que contienen azcares aadidos.  Consuma alimentos saludables. Alterne 3 comidas con 3 colaciones.  Aumente de peso saludablemente. El aumento del peso total vara de acuerdo con el ndice de masa corporal que tena antes del embarazo ([facility]).  Lleve una tarjeta de alerta mdica o use una pulsera o medalla de alerta mdica.  Lleve con usted una colacin de 15gramos de hidratos de carbono en todo momento para controlar los niveles bajos de glucosa en la sangre (hipoglucemia). Algunos ejemplos de colaciones de 15gramos de hidratos de carbono son los siguientes:  Tabletas de glucosa, 3 o 4.  Gel de glucosa, tubo de 15 gramos.  Pasas de uva, 2 cucharadas (24 g).  Caramelos de goma, 6.  Galletas de animales, 8.  Jugo de fruta, gaseosa comn, o leche descremada, 4 onzas (120 ml).  Pastillas de goma, 9.  Reconocer la hipoglucemia. Durante el embarazo la hipoglucemia se produce cuando hay niveles de glucosa en la  sangre de 60 mg/dl o menos. El riesgo de hipoglucemia aumenta durante el ayuno o cuando se saltea las comidas, durante o despus de realizar ejercicio intenso y mientras duerme. Los sntomas de hipoglucemia son:  Temblores o sacudidas.  Disminucin de la capacidad de concentracin.  Sudoracin.  Aumento de la frecuencia cardaca.  Dolor de cabeza.  Sequedad en la boca.  Hambre.  Irritabilidad.  Ansiedad.  Sueo agitado.  Alteracin del habla o de la coordinacin.  Confusin.  Tratar la hipoglucemia rpidamente. Si usted est alerta y puede tragar con seguridad, siga la regla de 15/15 que consiste en:  Tome entre 15 y 20gramos de glucosa de accin rpida o carbohidratos. Las opciones de accin rpida son un gel de glucosa, tabletas de glucosa, o 4 onzas (120 ml) de jugo de frutas, gaseosa comn, o leche baja en grasa.  Compruebe su nivel de glucosa en la sangre 15 minutos despus de tomar la glucosa.  Tome entre 15 y 20 gramos ms de glucosa si el nivel de glucosa en la sangre todava es de 70mg/dl o inferior.  Ingiera una comida o una colacin en el lapso de 1 hora una vez que los niveles de glucosa en la sangre vuelven   a la normalidad.  Est atento a la poliuria (miccin excesiva) y la polidipsia (sensacin de mucha sed), que son los primeros signos de la hiperglucemia. El reconocimiento temprano de la hiperglucemia permite un tratamiento oportuno. Trate la hiperglucemia segn le indic su mdico.  Haga actividad fsica por lo menos 30minutos al da o como lo indique su mdico. Se recomienda que 30 minutos despus de cada comida, realice diez minutos de actividad fsica para controlar los niveles de glucosa postprandial en la sangre.  Ajuste su dosis de insulina y la ingesta de alimentos, segn sea necesario, si inicia un nuevo ejercicio o deporte.  Siga su plan para los das de enfermedad cuando no pueda comer o beber como de costumbre.  Evite el tabaco y el  alcohol.  Concurra a todas las visitas de control como se lo haya indicado el mdico.  Siga el consejo del mdico respecto a los controles prenatales y posteriores al parto (postparto), las visitas, la planificacin de las comidas, el ejercicio, los medicamentos, las vitaminas, los anlisis de sangre, otras pruebas mdicas y actividades fsicas.  Realice diariamente el cuidado de la piel y de los pies. Examine su piel y los pies diariamente para ver si tiene cortes, moretones, enrojecimiento, problemas en las uas, sangrado, ampollas o llagas.  Cepllese los dientes y encas por lo menos dos veces al da y use hilo dental al menos una vez por da. Concurra regularmente a las visitas de control con el dentista.  Programe un examen de vista durante el primer trimestre de su embarazo o como lo indique su mdico.  Comparta su plan de control de diabetes en el trabajo o en la escuela.  Mantngase al da con las vacunas.  Aprenda a manejar el estrs.  Obtenga la mayor cantidad posible de informacin sobre la diabetes y solicite ayuda siempre que sea necesario.  Obtenga informacin sobre el amamantamiento y analice esta posibilidad.  Debe controlar el nivel de azcar en la sangre de 6a 12semanas despus del parto. Esto se hace con una prueba de tolerancia a la glucosa oral (PTGO). SOLICITE ATENCIN MDICA SI:   No puede comer alimentos o beber por ms de 6 horas.  Tuvo nuseas o ha vomitado durante ms de 6 horas.  Tiene un nivel de glucosa en la sangre de 200 mg/dl y cetonas en la orina.  Presenta algn cambio en el estado mental.  Desarrolla problemas de visin.  Sufre un dolor persistente de cabeza.  Siente dolor o molestias en la parte superior del abdomen.  Desarrolla una enfermedad grave adicional.  Tuvo diarrea durante ms de 6 horas.  Ha estado enfermo o ha tenido fiebre durante un par de das y no mejora. SOLICITE ATENCIN MDICA DE INMEDIATO SI:   Tiene dificultad  para respirar.  Ya no siente los movimientos del beb.  Est sangrando o tiene flujo vaginal.  Comienza a tener contracciones o trabajo de parto prematuro. ASEGRESE DE QUE:  Comprende estas instrucciones.  Controlar su afeccin.  Recibir ayuda de inmediato si no mejora o si empeora. Document Released: 06/05/2005 Document Revised: 01/10/2014 [employer] Patient Information 2015 [employer]. This information is not intended to replace advice given to you by your health care provider. Make sure you discuss any questions you have with your health care provider.  Lactancia materna (Breastfeeding) Decidir amamantar es una de las mejores elecciones que puede hacer por usted y su beb. El cambio hormonal durante el embarazo produce el desarrollo del tejido mamario y aumenta la cantidad   y el tamao de los conductos galactforos. Estas hormonas tambin permiten que las protenas, los azcares y las grasas de la sangre produzcan la leche materna en las glndulas productoras de leche. Las hormonas impiden que la leche materna sea liberada antes del nacimiento del beb, adems de impulsar el flujo de leche luego del nacimiento. Una vez que ha comenzado a amamantar, pensar en el beb, as como la succin o el llanto, pueden estimular la liberacin de leche de las glndulas productoras de leche.  LOS BENEFICIOS DE AMAMANTAR Para el beb  La primera leche (calostro) ayuda a mejorar el funcionamiento del sistema digestivo del beb.  La leche tiene anticuerpos que ayudan a prevenir las infecciones en el beb.  El beb tiene una menor incidencia de asma, alergias y del sndrome de muerte sbita del lactante.  Los nutrientes en la leche materna son mejores para el beb que la leche maternizada y estn preparados exclusivamente para cubrir las necesidades del beb.  La leche materna mejora el desarrollo cerebral del beb.  Es menos probable que el beb desarrolle otras enfermedades, como obesidad  infantil, asma o diabetes mellitus de tipo 2. Para usted   La lactancia materna favorece el desarrollo de un vnculo muy especial entre la madre y el beb.  Es conveniente. La leche materna siempre est disponible a la temperatura correcta y es econmica.  La lactancia materna ayuda a quemar caloras y a perder el peso ganado durante el embarazo.  Favorece la contraccin del tero al tamao que tena antes del embarazo de manera ms rpida y disminuye el sangrado (loquios) despus del parto.  La lactancia materna contribuye a reducir el riesgo de desarrollar diabetes mellitus de tipo 2, osteoporosis o cncer de mama o de ovario en el futuro. SIGNOS DE QUE EL BEB EST HAMBRIENTO Primeros signos de hambre  Aumenta su estado de alerta o actividad.  Se estira.  Mueve la cabeza de un lado a otro.  Mueve la cabeza y abre la boca cuando se le toca la mejilla o la comisura de la boca (reflejo de bsqueda).  Aumenta las vocalizaciones, tales como sonidos de succin, se relame los labios, emite arrullos, suspiros, o chirridos.  Mueve la mano hacia la boca.  Se chupa con ganas los dedos o las manos. Signos tardos de hambre  Est agitado.  Llora de manera intermitente. Signos de hambre extrema Los signos de hambre extrema requerirn que lo calme y lo consuele antes de que el beb pueda alimentarse adecuadamente. No espere a que se manifiesten los siguientes signos de hambre extrema para comenzar a amamantar:   Agitacin.  Llanto intenso y fuerte.   Gritos. INFORMACIN BSICA SOBRE LA LACTANCIA MATERNA Iniciacin de la lactancia materna  Encuentre un lugar cmodo para sentarse o acostarse, con un buen respaldo para el cuello y la espalda.  Coloque una almohada o una manta enrollada debajo del beb para acomodarlo a la altura de la mama (si est sentada). Las almohadas para amamantar se han diseado especialmente a fin de servir de apoyo para los brazos y el beb mientras  amamanta.  Asegrese de que el abdomen del beb est frente al suyo.  Masajee suavemente la mama. Con las yemas de los dedos, masajee la pared del pecho hacia el pezn en un movimiento circular. Esto estimula el flujo de leche. Es posible que deba continuar este movimiento mientras amamanta si la leche fluye lentamente.  Sostenga la mama con el pulgar por arriba del pezn y los otros   4 dedos por debajo de la mama. Asegrese de que los dedos se encuentren lejos del pezn y de la boca del beb.  Empuje suavemente los labios del beb con el pezn o con el dedo.  Cuando la boca del beb se abra lo suficiente, acrquelo rpidamente a la mama e introduzca todo el pezn y la zona oscura que lo rodea (areola), tanto como sea posible, dentro de la boca del beb.  Debe haber ms areola visible por arriba del labio superior del beb que por debajo del labio inferior.  La lengua del beb debe estar entre la enca inferior y la mama.  Asegrese de que la boca del beb est en la posicin correcta alrededor del pezn (prendida). Los labios del beb deben crear un sello sobre la mama y estar doblados hacia afuera (invertidos).  Es comn que el beb succione durante 2 a 3 minutos para que comience el flujo de leche materna. Cmo debe prenderse Es muy importante que le ensee al beb cmo prenderse adecuadamente a la mama. Si el beb no se prende adecuadamente, puede causarle dolor en el pezn y reducir la produccin de leche materna, y hacer que el beb tenga un escaso aumento de peso. Adems, si el beb no se prende adecuadamente al pezn, puede tragar aire durante la alimentacin. Esto puede causarle molestias al beb. Hacer eructar al beb al cambiar de mama puede ayudarlo a liberar el aire. Sin embargo, ensearle al beb cmo prenderse a la mama adecuadamente es la mejor manera de evitar que se sienta molesto por tragar aire mientras se alimenta. Signos de que el beb se ha prendido adecuadamente al pezn:    Tironea o succiona de modo silencioso, sin causarle dolor.  Se escucha que traga cada 3 o 4 succiones.   Hay movimientos musculares por arriba y por delante de sus odos al succionar. Signos de que el beb no se ha prendido adecuadamente al pezn:   Hace ruidos de succin o de chasquido mientras se alimenta.  Siente dolor en el pezn. Si cree que el beb no se prendi correctamente, deslice el dedo en la comisura de la boca y colquelo entre las encas del beb para interrumpir la succin. Intente comenzar a amamantar nuevamente. Signos de lactancia materna exitosa Signos del beb:   Disminuye gradualmente el nmero de succiones o cesa la succin por completo.  Se duerme.  Relaja el cuerpo.  Retiene una pequea cantidad de leche en la boca.  Se desprende solo del pecho. Signos que presenta usted:  Las mamas han aumentado la firmeza, el peso y el tamao 1 a 3 horas despus de amamantar.  Estn ms blandas inmediatamente despus de amamantar.  Un aumento del volumen de leche, y tambin un cambio en su consistencia y color se producen hacia el quinto da de lactancia materna.  Los pezones no duelen, ni estn agrietados ni sangran. Signos de que su beb recibe la cantidad de leche suficiente  Moja al menos 3 paales en 24 horas. La orina debe ser clara y de color amarillo plido a los 5 das de vida.  Defeca al menos 3 veces en 24 horas a los 5 das de vida. La materia fecal debe ser blanda y amarillenta.  Defeca al menos 3 veces en 24 horas a los 7 das de vida. La materia fecal debe ser grumosa y amarillenta.  No registra una prdida de peso mayor del 10% del peso al nacer durante los primeros 3 das de vida.    Aumenta de peso un promedio de 4 a 7onzas (113 a 198g) por semana despus de los 4 das de vida.  Aumenta de peso, diariamente, de manera uniforme a partir de los 5 das de vida, sin registrar prdida de peso despus de las 2semanas de vida. Despus de  alimentarse, es posible que el beb regurgite una pequea cantidad. Esto es frecuente. FRECUENCIA Y DURACIN DE LA LACTANCIA MATERNA El amamantamiento frecuente la ayudar a producir ms leche y a prevenir problemas de dolor en los pezones e hinchazn en las mamas. Alimente al beb cuando muestre signos de hambre o si siente la necesidad de reducir la congestin de las mamas. Esto se denomina "lactancia a demanda". Evite el uso del chupete mientras trabaja para establecer la lactancia (las primeras 4 a 6 semanas despus del nacimiento del beb). Despus de este perodo, podr ofrecerle un chupete. Las investigaciones demostraron que el uso del chupete durante el primer ao de vida del beb disminuye el riesgo de desarrollar el sndrome de muerte sbita del lactante (SMSL). Permita que el nio se alimente en cada mama todo lo que desee. Contine amamantando al beb hasta que haya terminado de alimentarse. Cuando el beb se desprende o se queda dormido mientras se est alimentando de la primera mama, ofrzcale la segunda. Debido a que, con frecuencia, los recin nacidos permanecen somnolientos las primeras semanas de vida, es posible que deba despertar al beb para alimentarlo. Los horarios de lactancia varan de un beb a otro. Sin embargo, las siguientes reglas pueden servir como gua para ayudarla a garantizar que el beb se alimenta adecuadamente:  Se puede amamantar a los recin nacidos (bebs de 4 semanas o menos de vida) cada 1 a 3 horas.  No deben transcurrir ms de 3 horas durante el da o 5 horas durante la noche sin que se amamante a los recin nacidos.  Debe amamantar al beb 8 veces como mnimo en un perodo de 24 horas, hasta que comience a introducir slidos en su dieta, a los 6 meses de vida aproximadamente. EXTRACCIN DE LECHE MATERNA La extraccin y el almacenamiento de la leche materna le permiten asegurarse de que el beb se alimente exclusivamente de leche materna, aun en momentos en  los que no puede amamantar. Esto tiene especial importancia si debe regresar al trabajo en el perodo en que an est amamantando o si no puede estar presente en los momentos en que el beb debe alimentarse. Su asesor en lactancia puede orientarla sobre cunto tiempo es seguro almacenar leche materna.  El sacaleche es un aparato que le permite extraer leche de la mama a un recipiente estril. Luego, la leche materna extrada puede almacenarse en un refrigerador o congelador. Algunos sacaleches son manuales, mientras que otros son elctricos. Consulte a su asesor en lactancia qu tipo ser ms conveniente para usted. Los sacaleches se pueden comprar; sin embargo, algunos hospitales y grupos de apoyo a la lactancia materna alquilan sacaleches mensualmente. Un asesor en lactancia puede ensearle cmo extraer leche materna manualmente, en caso de que prefiera no usar un sacaleche.  CMO CUIDAR LAS MAMAS DURANTE LA LACTANCIA MATERNA Los pezones se secan, agrietan y duelen durante la lactancia materna. Las siguientes recomendaciones pueden ayudarla a mantener las mamas humectadas y sanas:  Evite usar jabn en los pezones.  Use un sostn de soporte. Aunque no son esenciales, las camisetas sin mangas o los sostenes especiales para amamantar estn diseados para acceder fcilmente a las mamas, para amamantar sin tener que quitarse todo   el sostn o la camiseta. Evite usar sostenes con aro o sostenes muy ajustados.  Seque al aire sus pezones durante 3 a 4minutos despus de amamantar al beb.  Utilice solo apsitos de algodn en el sostn para absorber las prdidas de leche. La prdida de un poco de leche materna entre las tomas es normal.  Utilice lanolina sobre los pezones luego de amamantar. La lanolina ayuda a mantener la humedad normal de la piel. Si usa lanolina pura, no tiene que lavarse los pezones antes de volver a alimentar al beb. La lanolina pura no es txica para el beb. Adems, puede extraer  manualmente algunas gotas de leche materna y masajear suavemente esa leche sobre los pezones, para que la leche se seque al aire. Durante las primeras semanas despus de dar a luz, algunas mujeres pueden experimentar hinchazn en las mamas (congestin mamaria). La congestin puede hacer que sienta las mamas pesadas, calientes y sensibles al tacto. El pico de la congestin ocurre dentro de los 3 a 5 das despus del parto. Las siguientes recomendaciones pueden ayudarla a aliviar la congestin:  Vace por completo las mamas al amamantar o extraer leche. Puede aplicar calor hmedo en las mamas (en la ducha o con toallas hmedas para manos) antes de amamantar o extraer leche. Esto aumenta la circulacin y ayuda a que la leche fluya. Si el beb no vaca por completo las mamas cuando lo amamanta, extraiga la leche restante despus de que haya finalizado.  Use un sostn ajustado (para amamantar o comn) o una camiseta sin mangas durante 1 o 2 das para indicar al cuerpo que disminuya ligeramente la produccin de leche.  Aplique compresas de hielo sobre las mamas, a menos que le resulte demasiado incmodo.  Asegrese de que el beb est prendido y se encuentre en la posicin correcta mientras lo alimenta. Si la congestin persiste luego de 48 horas o despus de seguir estas recomendaciones, comunquese con su mdico o un asesor en lactancia. RECOMENDACIONES GENERALES PARA EL CUIDADO DE LA SALUD DURANTE LA LACTANCIA MATERNA  Consuma alimentos saludables. Alterne comidas y colaciones, y coma 3 de cada una por da. Dado que lo que come afecta la leche materna, es posible que algunas comidas hagan que su beb se vuelva ms irritable de lo habitual. Evite comer este tipo de alimentos si percibe que afectan de manera negativa al beb.  Beba leche, jugos de fruta y agua para satisfacer su sed (aproximadamente 10 vasos al da).  Descanse con frecuencia, reljese y tome sus vitaminas prenatales para evitar la  fatiga, el estrs y la anemia.  Contine con los autocontroles de la mama.  Evite masticar y fumar tabaco.  Evite el consumo de alcohol y drogas. Algunos medicamentos, que pueden ser perjudiciales para el beb, pueden pasar a travs de la leche materna. Es importante que consulte a su mdico antes de tomar cualquier medicamento, incluidos todos los medicamentos recetados y de venta libre, as como los suplementos vitamnicos y herbales. Puede quedar embarazada durante la lactancia. Si desea controlar la natalidad, consulte a su mdico cules son las opciones ms seguras para el beb. SOLICITE ATENCIN MDICA SI:   Usted siente que quiere dejar de amamantar o se siente frustrada con la lactancia.  Siente dolor en las mamas o en los pezones.  Sus pezones estn agrietados o sangran.  Sus pechos estn irritados, sensibles o calientes.  Tiene un rea hinchada en cualquiera de las mamas.  Siente escalofros o fiebre.  Tiene nuseas o vmitos.    Presenta una secrecin de otro lquido distinto de la leche materna de los pezones.  Sus mamas no se llenan antes de amamantar al beb para el quinto da despus del parto.  Se siente triste y deprimida.  El beb est demasiado somnoliento como para comer bien.  El beb tiene problemas para dormir.  Moja menos de 3 paales en 24 horas.  Defeca menos de 3 veces en 24 horas.  La piel del beb o la parte blanca de los ojos se vuelven amarillentas.  El beb no ha aumentado de peso a los 5 das de vida. SOLICITE ATENCIN MDICA DE INMEDIATO SI:   El beb est muy cansado (letargo) y no se quiere despertar para comer.  Le sube la fiebre sin causa. Document Released: 08/26/2005 Document Revised: 08/31/2013 ExitCare Patient Information 2015 [employer]. This information is not intended to replace advice given to you by your health care provider. Make sure you discuss any questions you have with your health care provider.  

## 2014-09-21 LAB — POCT URINALYSIS DIP (DEVICE)
Bilirubin Urine: NEGATIVE
Glucose, UA: NEGATIVE mg/dL
Ketones, ur: NEGATIVE mg/dL
NITRITE: NEGATIVE
PH: 6.5 (ref 5.0–8.0)
Protein, ur: NEGATIVE mg/dL
Specific Gravity, Urine: 1.025 (ref 1.005–1.030)
Urobilinogen, UA: 1 mg/dL (ref 0.0–1.0)

## 2014-09-23 ENCOUNTER — Ambulatory Visit (INDEPENDENT_AMBULATORY_CARE_PROVIDER_SITE_OTHER): Payer: Self-pay | Admitting: *Deleted

## 2014-09-23 VITALS — BP 101/65 | HR 70

## 2014-09-23 DIAGNOSIS — O24419 Gestational diabetes mellitus in pregnancy, unspecified control: Secondary | ICD-10-CM

## 2014-09-23 LAB — US OB FOLLOW UP

## 2014-09-23 NOTE — Progress Notes (Signed)
nst reactive

## 2014-09-23 NOTE — Progress Notes (Signed)
Interpreter - Adriana Zavala-Martinez present for encounter.  

## 2014-09-26 ENCOUNTER — Ambulatory Visit (INDEPENDENT_AMBULATORY_CARE_PROVIDER_SITE_OTHER): Payer: Self-pay | Admitting: Obstetrics & Gynecology

## 2014-09-26 VITALS — BP 103/61 | HR 64 | Temp 98.3°F | Wt 152.3 lb

## 2014-09-26 DIAGNOSIS — O24419 Gestational diabetes mellitus in pregnancy, unspecified control: Secondary | ICD-10-CM

## 2014-09-26 DIAGNOSIS — O0993 Supervision of high risk pregnancy, unspecified, third trimester: Secondary | ICD-10-CM

## 2014-09-26 LAB — POCT URINALYSIS DIP (DEVICE)
BILIRUBIN URINE: NEGATIVE
GLUCOSE, UA: NEGATIVE mg/dL
Ketones, ur: NEGATIVE mg/dL
NITRITE: NEGATIVE
PH: 7 (ref 5.0–8.0)
PROTEIN: NEGATIVE mg/dL
Specific Gravity, Urine: 1.02 (ref 1.005–1.030)
Urobilinogen, UA: 0.2 mg/dL (ref 0.0–1.0)

## 2014-09-26 NOTE — Progress Notes (Signed)
Used Interpreter Elna Breslowarol Hernandez. C/o starting to feel sharp stabbing pelvic pains at times.

## 2014-09-26 NOTE — Progress Notes (Signed)
Patient is Spanish-speaking only, Spanish interpreter present for this encounter.  Blood sugars are within range, continue Glyburide 2.5 mg po qhs NST performed today was reviewed and was found to be reactive. Continue recommended antenatal testing and prenatal care.  No other complaints or concerns. Labor and fetal movement precautions reviewed. 

## 2014-09-26 NOTE — Patient Instructions (Signed)
Regrese a la clinica cuando tenga su cita. Si tiene problemas o preguntas, llama a la clinica o vaya a la sala [facility] emergencia al [facility].    

## 2014-09-29 ENCOUNTER — Telehealth: Payer: Self-pay | Admitting: *Deleted

## 2014-09-29 NOTE — Telephone Encounter (Signed)
[employer] Interpreter [phone]#[id number],   Attempted to contact patient, no answer, left message that clinic will be closed on 09/30/14 due to inclement weather. Reviewed sigh and symptoms to go to MAU over the weekend if she experiences any problems.

## 2014-09-30 ENCOUNTER — Other Ambulatory Visit: Payer: Self-pay

## 2014-10-03 ENCOUNTER — Other Ambulatory Visit: Payer: Self-pay | Admitting: Obstetrics & Gynecology

## 2014-10-03 ENCOUNTER — Ambulatory Visit (INDEPENDENT_AMBULATORY_CARE_PROVIDER_SITE_OTHER): Payer: Self-pay | Admitting: Obstetrics & Gynecology

## 2014-10-03 VITALS — BP 103/58 | HR 66 | Temp 98.2°F | Wt 155.3 lb

## 2014-10-03 DIAGNOSIS — O24419 Gestational diabetes mellitus in pregnancy, unspecified control: Secondary | ICD-10-CM

## 2014-10-03 LAB — POCT URINALYSIS DIP (DEVICE)
BILIRUBIN URINE: NEGATIVE
Glucose, UA: NEGATIVE mg/dL
Hgb urine dipstick: NEGATIVE
Ketones, ur: NEGATIVE mg/dL
Nitrite: NEGATIVE
PH: 7 (ref 5.0–8.0)
Protein, ur: NEGATIVE mg/dL
Specific Gravity, Urine: 1.02 (ref 1.005–1.030)
Urobilinogen, UA: 0.2 mg/dL (ref 0.0–1.0)

## 2014-10-03 LAB — US OB FOLLOW UP

## 2014-10-03 LAB — OB RESULTS CONSOLE GBS: GBS: POSITIVE

## 2014-10-03 NOTE — Patient Instructions (Signed)
Regrese a la clinica cuando tenga su cita. Si tiene problemas o preguntas, llama a la clinica o vaya a la sala [facility] emergencia al [facility].    

## 2014-10-03 NOTE — Progress Notes (Signed)
Anna Wang used for interpreter Patient reports pelvic pressure/pain

## 2014-10-03 NOTE — Progress Notes (Signed)
Patient is Spanish-speaking only, Spanish interpreter present for this encounter.  Blood sugars are within range, continue Glyburide 2.5 mg po qhs NST performed today was reviewed and was found to be reactive. Continue recommended antenatal testing and prenatal care.  No other complaints or concerns. Labor and fetal movement precautions reviewed.

## 2014-10-04 LAB — CULTURE, BETA STREP (GROUP B ONLY)

## 2014-10-04 LAB — GC/CHLAMYDIA PROBE AMP
CT Probe RNA: NEGATIVE
GC PROBE AMP APTIMA: NEGATIVE

## 2014-10-05 ENCOUNTER — Encounter: Payer: Self-pay | Admitting: Obstetrics & Gynecology

## 2014-10-05 DIAGNOSIS — O99820 Streptococcus B carrier state complicating pregnancy: Secondary | ICD-10-CM | POA: Insufficient documentation

## 2014-10-07 ENCOUNTER — Ambulatory Visit (INDEPENDENT_AMBULATORY_CARE_PROVIDER_SITE_OTHER): Payer: Self-pay | Admitting: *Deleted

## 2014-10-07 VITALS — BP 103/66 | HR 79

## 2014-10-07 DIAGNOSIS — O24419 Gestational diabetes mellitus in pregnancy, unspecified control: Secondary | ICD-10-CM

## 2014-10-07 NOTE — Progress Notes (Signed)
Interpreter Raquel Mora present for encounter.  

## 2014-10-07 NOTE — Progress Notes (Signed)
1/29 NST reviewed and reactive 

## 2014-10-10 ENCOUNTER — Encounter: Payer: Self-pay | Admitting: Obstetrics & Gynecology

## 2014-10-10 ENCOUNTER — Ambulatory Visit (INDEPENDENT_AMBULATORY_CARE_PROVIDER_SITE_OTHER): Payer: Self-pay | Admitting: Family Medicine

## 2014-10-10 VITALS — BP 102/69 | HR 75 | Wt 155.4 lb

## 2014-10-10 DIAGNOSIS — O0993 Supervision of high risk pregnancy, unspecified, third trimester: Secondary | ICD-10-CM

## 2014-10-10 DIAGNOSIS — O24419 Gestational diabetes mellitus in pregnancy, unspecified control: Secondary | ICD-10-CM

## 2014-10-10 LAB — US OB FOLLOW UP

## 2014-10-10 LAB — POCT URINALYSIS DIP (DEVICE)
BILIRUBIN URINE: NEGATIVE
Glucose, UA: NEGATIVE mg/dL
Ketones, ur: NEGATIVE mg/dL
Nitrite: NEGATIVE
Protein, ur: NEGATIVE mg/dL
SPECIFIC GRAVITY, URINE: 1.02 (ref 1.005–1.030)
Urobilinogen, UA: 0.2 mg/dL (ref 0.0–1.0)
pH: 7 (ref 5.0–8.0)

## 2014-10-10 NOTE — Progress Notes (Signed)
[name] present for encounter.  US growth on 2/8.

## 2014-10-10 NOTE — Patient Instructions (Signed)
Tercer trimestre del embarazo  (Third Trimester of Pregnancy)  El tercer trimestre del embarazo abarca desde la semana 29 hasta la semana 42, desde el 7 mes hasta el 9. En este trimestre el beb (feto) se desarrolla muy rpidamente. Hacia el final del noveno mes, el beb que an no ha nacido mide alrededor de 20 pulgadas (45 cm) de largo. Y pesa entre 6 y 10 libras (2,700 y 4,500 kg).  CUIDADOS EN EL HOGAR   Evite fumar, consumir hierbas y beber alcohol. Evite los frmacos que no apruebe el mdico.  Slo tome los medicamentos que le haya indicado su mdico. Algunos medicamentos son seguros para tomar durante el embarazo y otros no lo son.  Haga ejercicios slo como le indique el mdico. Deje de hacer ejercicios si comienza a tener clicos.  Haga comidas regulares y sanas.  Use un sostn que le brinde buen soporte si sus mamas estn sensibles.  No utilice la baera con agua caliente, baos turcos y saunas.  Colquese el cinturn de seguridad cuando conduzca.  Evite comer carne cruda y el contacto con los utensilios y desperdicios de los gatos.  Tome las vitaminas indicadas para la etapa prenatal.  Trate de tomar medicamentos para mover el intestino (laxantes) segn lo necesario y si su mdico la autoriza. Consuma ms fibra comiendo frutas y vegetales frescos y granos enteros. Beba gran cantidad de lquido para mantener el pis (orina) de tono claro o amarillo plido.  Tome baos de agua tibia (baos de asiento) para calmar el dolor o las molestias causadas por las hemorroides. Use una crema para las hemorroides si el mdico la autoriza.  Si tiene venas hinchadas y abultadas (venas varicosas), use medias de soporte. Eleve (levante) los pies durante 15 minutos, 3 o 4 veces por da. Limite el consumo de sal en su dieta.  Evite levantar objetos pesados, usar tacones altos y sintese derecha.  Descanse con las piernas elevadas si tiene calambres o dolor de cintura.  Visite a su dentista  si no lo ha hecho durante el embarazo. Use un cepillo de dientes blando para higienizarse los dientes. Use suavemente el hilo dental.  Puede tener sexo (relaciones sexuales) siempre que el mdico la autorice.  No viaje por largas distancias si puede evitarlo. Slo hgalo con la aprobacin de su mdico.  Haga el curso pre parto.  Practique conducir hasta el hospital.  Prepare el bolso que llevar.  Prepare la habitacin del beb.  Concurra a los controles mdicos. SOLICITE AYUDA SI:   No est segura si est en trabajo de parto o ha roto la bolsa de aguas.  Tiene mareos.  Siente clicos intensos o presin en la zona baja del vientre (abdomen).  Siente un dolor persistente en la zona del vientre.  Tiene malestar estomacal (nuseas), devuelve (vomita), o tiene deposiciones acuosas (diarrea).  Advierte un olor ftido que proviene de la vagina.  Siente dolor al hacer pis (orinar). SOLICITE AYUDA DE INMEDIATO SI:   Tiene fiebre.  Pierde lquido o sangre por la vagina.  Tiene sangrando o pequeas prdidas vaginales.  Siente dolor intenso o clicos en el abdomen.  Sube o baja de peso rpidamente.  Tiene dificultad para respirar o siente dolor en el pecho.  Sbitamente se le hinchan el rostro, las manos, los tobillos, los pies o las piernas.  No ha sentido los movimientos del beb durante una hora.  Siente un dolor de cabeza intenso que no se alivia con medicamentos.  Su visin se modifica. Document   Released: 04/28/2013 [employer] Patient Information 2015 [employer]. This information is not intended to replace advice given to you by your health care provider. Make sure you discuss any questions you have with your health care provider.  

## 2014-10-10 NOTE — Progress Notes (Signed)
NST reactive Good fetal movement CBG - fasting 1:7 elevated 2hr PP - 1-2 elevated.

## 2014-10-13 ENCOUNTER — Ambulatory Visit (INDEPENDENT_AMBULATORY_CARE_PROVIDER_SITE_OTHER): Payer: Self-pay | Admitting: *Deleted

## 2014-10-13 DIAGNOSIS — O24419 Gestational diabetes mellitus in pregnancy, unspecified control: Secondary | ICD-10-CM

## 2014-10-13 NOTE — Progress Notes (Signed)
Interpreter - Anna PelSylvia Wang present for encounter.  Pt reports she has pain and pelvic pressure when she is walking and wants to know if this is normal and if she should stop. I advised that this is normal. She may want to try walking more slowly and/or supporting her lower abdomen while walking.  Pt encouraged to participate in physical activity as tolerated. She voiced understanding.

## 2014-10-13 NOTE — Progress Notes (Signed)
NST reactive.

## 2014-10-17 ENCOUNTER — Ambulatory Visit (INDEPENDENT_AMBULATORY_CARE_PROVIDER_SITE_OTHER): Payer: Self-pay | Admitting: Obstetrics and Gynecology

## 2014-10-17 ENCOUNTER — Ambulatory Visit (HOSPITAL_COMMUNITY)
Admission: RE | Admit: 2014-10-17 | Discharge: 2014-10-17 | Disposition: A | Discharge status: Home / Self Care | Payer: Self-pay | Source: Ambulatory Visit | Attending: Family Medicine | Admitting: Family Medicine

## 2014-10-17 VITALS — BP 107/64 | HR 61 | Temp 98.0°F | Wt 154.3 lb

## 2014-10-17 DIAGNOSIS — O24419 Gestational diabetes mellitus in pregnancy, unspecified control: Secondary | ICD-10-CM | POA: Insufficient documentation

## 2014-10-17 DIAGNOSIS — Z22330 Carrier of Group B streptococcus: Secondary | ICD-10-CM

## 2014-10-17 DIAGNOSIS — O99820 Streptococcus B carrier state complicating pregnancy: Secondary | ICD-10-CM

## 2014-10-17 DIAGNOSIS — Z3A37 37 weeks gestation of pregnancy: Secondary | ICD-10-CM | POA: Insufficient documentation

## 2014-10-17 DIAGNOSIS — O0993 Supervision of high risk pregnancy, unspecified, third trimester: Secondary | ICD-10-CM

## 2014-10-17 LAB — POCT URINALYSIS DIP (DEVICE)
Bilirubin Urine: NEGATIVE
Glucose, UA: NEGATIVE mg/dL
Hgb urine dipstick: NEGATIVE
KETONES UR: NEGATIVE mg/dL
Nitrite: NEGATIVE
Protein, ur: NEGATIVE mg/dL
Specific Gravity, Urine: 1.015 (ref 1.005–1.030)
Urobilinogen, UA: 0.2 mg/dL (ref 0.0–1.0)
pH: 7 (ref 5.0–8.0)

## 2014-10-17 NOTE — Progress Notes (Signed)
OBF/NST Spanish Interpreter for Liberty MutualEncounter Anna Wang Pt reports having some spotting on Saturday US for growth done today

## 2014-10-17 NOTE — Progress Notes (Signed)
IOL 10/26/14 @ 730p, Spanish interpreter Elna BreslowCarol Hernandez present.

## 2014-10-17 NOTE — Progress Notes (Signed)
Doing well today. +FM. No VB, LOF. Irregular contractions. Interpreter used.   1. A2GDM. Currently taking glyburide 2.5 mg nightly. Blood sugars well controlled per report. She forgot book to review. NST reactive today 135/mod/+A/-D. Continue twice weekly NST. Growth ultrasound EFW 3192 gm (70%ile). IOL scheduled for 2/17. 2. GBS positive. Needs ABX in labor. Patient aware.  3. Routine PNC. FM/Labor precautions reviewed.

## 2014-10-20 ENCOUNTER — Inpatient Hospital Stay (HOSPITAL_COMMUNITY)
Admission: AD | Admit: 2014-10-20 | Discharge: 2014-10-22 | DRG: 775 | Disposition: A | Discharge status: Home / Self Care | Payer: Medicaid Other | Source: Ambulatory Visit | Attending: Obstetrics & Gynecology | Admitting: Obstetrics & Gynecology

## 2014-10-20 ENCOUNTER — Other Ambulatory Visit: Payer: Self-pay

## 2014-10-20 ENCOUNTER — Inpatient Hospital Stay (HOSPITAL_COMMUNITY)
Admission: AD | Admit: 2014-10-20 | Discharge: 2014-10-20 | Discharge status: Absent without leave | Payer: Self-pay | Source: Ambulatory Visit | Attending: Obstetrics & Gynecology | Admitting: Obstetrics & Gynecology

## 2014-10-20 ENCOUNTER — Encounter (HOSPITAL_COMMUNITY): Payer: Self-pay | Admitting: *Deleted

## 2014-10-20 DIAGNOSIS — O4290 Premature rupture of membranes, unspecified as to length of time between rupture and onset of labor, unspecified weeks of gestation: Secondary | ICD-10-CM

## 2014-10-20 DIAGNOSIS — O471 False labor at or after 37 completed weeks of gestation: Secondary | ICD-10-CM | POA: Diagnosis present

## 2014-10-20 DIAGNOSIS — Z3A38 38 weeks gestation of pregnancy: Secondary | ICD-10-CM | POA: Diagnosis present

## 2014-10-20 DIAGNOSIS — O24410 Gestational diabetes mellitus in pregnancy, diet controlled: Secondary | ICD-10-CM | POA: Diagnosis present

## 2014-10-20 DIAGNOSIS — O99824 Streptococcus B carrier state complicating childbirth: Secondary | ICD-10-CM | POA: Diagnosis present

## 2014-10-20 DIAGNOSIS — O24429 Gestational diabetes mellitus in childbirth, unspecified control: Secondary | ICD-10-CM

## 2014-10-20 DIAGNOSIS — O99820 Streptococcus B carrier state complicating pregnancy: Secondary | ICD-10-CM

## 2014-10-20 DIAGNOSIS — Z79899 Other long term (current) drug therapy: Secondary | ICD-10-CM

## 2014-10-20 HISTORY — DX: Gastritis, unspecified, without bleeding: K29.70

## 2014-10-20 HISTORY — DX: Gestational diabetes mellitus in pregnancy, unspecified control: O24.419

## 2014-10-20 LAB — GLUCOSE, CAPILLARY: Glucose-Capillary: 81 mg/dL (ref 70–99)

## 2014-10-20 LAB — TYPE AND SCREEN
ABO/RH(D): O POS
Antibody Screen: NEGATIVE

## 2014-10-20 LAB — CBC
HEMATOCRIT: 36 % (ref 36.0–46.0)
HEMOGLOBIN: 12.6 g/dL (ref 12.0–15.0)
MCH: 30.7 pg (ref 26.0–34.0)
MCHC: 35 g/dL (ref 30.0–36.0)
MCV: 87.6 fL (ref 78.0–100.0)
Platelets: 151 10*3/uL (ref 150–400)
RBC: 4.11 MIL/uL (ref 3.87–5.11)
RDW: 13.2 % (ref 11.5–15.5)
WBC: 13.9 10*3/uL — ABNORMAL HIGH (ref 4.0–10.5)

## 2014-10-20 LAB — ABO/RH: ABO/RH(D): O POS

## 2014-10-20 MED ORDER — LACTATED RINGERS IV SOLN: 500.0000 mL | INTRAVENOUS | Status: DC | PRN

## 2014-10-20 MED ORDER — ACETAMINOPHEN 325 MG PO TABS: 650.0000 mg | ORAL_TABLET | ORAL | Status: DC | PRN

## 2014-10-20 MED ORDER — OXYTOCIN 40 UNITS IN LACTATED RINGERS INFUSION - SIMPLE MED
62.5000 mL/h | INTRAVENOUS | Status: DC
  Administered 2014-10-20: 999 mL/h via INTRAVENOUS
  Filled 2014-10-20: qty 1000

## 2014-10-20 MED ORDER — OXYCODONE-ACETAMINOPHEN 5-325 MG PO TABS: 2.0000 | ORAL_TABLET | ORAL | Status: DC | PRN

## 2014-10-20 MED ORDER — PRENATAL MULTIVITAMIN CH
1.0000 | ORAL_TABLET | Freq: Every day | ORAL | Status: DC
  Administered 2014-10-21 – 2014-10-22 (×2): 1 via ORAL
  Filled 2014-10-20 (×2): qty 1

## 2014-10-20 MED ORDER — SODIUM CHLORIDE 0.9 % IJ SOLN: 3.0000 mL | Freq: Two times a day (BID) | INTRAMUSCULAR | Status: DC

## 2014-10-20 MED ORDER — SODIUM CHLORIDE 0.9 % IJ SOLN: 3.0000 mL | INTRAMUSCULAR | Status: DC | PRN

## 2014-10-20 MED ORDER — OXYCODONE-ACETAMINOPHEN 5-325 MG PO TABS: 1.0000 | ORAL_TABLET | ORAL | Status: DC | PRN

## 2014-10-20 MED ORDER — OXYTOCIN BOLUS FROM INFUSION: 500.0000 mL | INTRAVENOUS | Status: DC

## 2014-10-20 MED ORDER — DIBUCAINE 1 % RE OINT: 1.0000 "application " | TOPICAL_OINTMENT | RECTAL | Status: DC | PRN

## 2014-10-20 MED ORDER — CITRIC ACID-SODIUM CITRATE 334-500 MG/5ML PO SOLN: 30.0000 mL | ORAL | Status: DC | PRN

## 2014-10-20 MED ORDER — OXYTOCIN 40 UNITS IN LACTATED RINGERS INFUSION - SIMPLE MED: 62.5000 mL/h | INTRAVENOUS | Status: DC | PRN

## 2014-10-20 MED ORDER — IBUPROFEN 600 MG PO TABS
600.0000 mg | ORAL_TABLET | Freq: Four times a day (QID) | ORAL | Status: DC
  Administered 2014-10-20 – 2014-10-22 (×8): 600 mg via ORAL
  Filled 2014-10-20 (×8): qty 1

## 2014-10-20 MED ORDER — ONDANSETRON HCL 4 MG PO TABS: 4.0000 mg | ORAL_TABLET | ORAL | Status: DC | PRN

## 2014-10-20 MED ORDER — ONDANSETRON HCL 4 MG/2ML IJ SOLN: 4.0000 mg | INTRAMUSCULAR | Status: DC | PRN

## 2014-10-20 MED ORDER — FLEET ENEMA 7-19 GM/118ML RE ENEM: 1.0000 | ENEMA | RECTAL | Status: DC | PRN

## 2014-10-20 MED ORDER — LIDOCAINE HCL (PF) 1 % IJ SOLN
30.0000 mL | INTRAMUSCULAR | Status: AC | PRN
  Administered 2014-10-20: 30 mL via SUBCUTANEOUS
  Filled 2014-10-20: qty 30

## 2014-10-20 MED ORDER — ONDANSETRON HCL 4 MG/2ML IJ SOLN: 4.0000 mg | Freq: Four times a day (QID) | INTRAMUSCULAR | Status: DC | PRN

## 2014-10-20 MED ORDER — FLEET ENEMA 7-19 GM/118ML RE ENEM: 1.0000 | ENEMA | Freq: Every day | RECTAL | Status: DC | PRN

## 2014-10-20 MED ORDER — DIPHENHYDRAMINE HCL 25 MG PO CAPS
25.0000 mg | ORAL_CAPSULE | Freq: Four times a day (QID) | ORAL | Status: DC | PRN
Start: 2014-10-20 — End: 2014-10-22

## 2014-10-20 MED ORDER — LACTATED RINGERS IV SOLN
INTRAVENOUS | Status: DC
  Administered 2014-10-20: 09:00:00 via INTRAVENOUS

## 2014-10-20 MED ORDER — PENICILLIN G POTASSIUM 5000000 UNITS IJ SOLR
5.0000 10*6.[IU] | Freq: Once | INTRAVENOUS | Status: AC
  Administered 2014-10-20: 5 10*6.[IU] via INTRAVENOUS
  Filled 2014-10-20: qty 5

## 2014-10-20 MED ORDER — ZOLPIDEM TARTRATE 5 MG PO TABS: 5.0000 mg | ORAL_TABLET | Freq: Every evening | ORAL | Status: DC | PRN

## 2014-10-20 MED ORDER — LANOLIN HYDROUS EX OINT: TOPICAL_OINTMENT | CUTANEOUS | Status: DC | PRN

## 2014-10-20 MED ORDER — PENICILLIN G POTASSIUM 5000000 UNITS IJ SOLR
2.5000 10*6.[IU] | INTRAVENOUS | Status: DC
  Filled 2014-10-20 (×4): qty 2.5

## 2014-10-20 MED ORDER — FENTANYL CITRATE 0.05 MG/ML IJ SOLN
100.0000 ug | INTRAMUSCULAR | Status: DC | PRN
  Administered 2014-10-20: 100 ug via INTRAVENOUS
  Filled 2014-10-20: qty 2

## 2014-10-20 MED ORDER — SODIUM CHLORIDE 0.9 % IV SOLN: 250.0000 mL | INTRAVENOUS | Status: DC | PRN

## 2014-10-20 MED ORDER — SIMETHICONE 80 MG PO CHEW: 80.0000 mg | CHEWABLE_TABLET | ORAL | Status: DC | PRN

## 2014-10-20 MED ORDER — WITCH HAZEL-GLYCERIN EX PADS: 1.0000 "application " | MEDICATED_PAD | CUTANEOUS | Status: DC | PRN

## 2014-10-20 MED ORDER — BISACODYL 10 MG RE SUPP: 10.0000 mg | Freq: Every day | RECTAL | Status: DC | PRN

## 2014-10-20 MED ORDER — SENNOSIDES-DOCUSATE SODIUM 8.6-50 MG PO TABS
2.0000 | ORAL_TABLET | ORAL | Status: DC
  Administered 2014-10-20 – 2014-10-21 (×2): 2 via ORAL
  Filled 2014-10-20 (×2): qty 2

## 2014-10-20 MED ORDER — BENZOCAINE-MENTHOL 20-0.5 % EX AERO
1.0000 "application " | INHALATION_SPRAY | CUTANEOUS | Status: DC | PRN
  Administered 2014-10-20: 1 via TOPICAL
  Filled 2014-10-20: qty 56

## 2014-10-20 NOTE — Progress Notes (Signed)
I stopped by to check patient's need, I assisted DR Beryle FlockBacigalupo with some informations about baby and [place] BingKim RN with a assessment, I ordered her meal too, by Orlan LeavensViria Alvarez Spanish Interpreter

## 2014-10-20 NOTE — MAU Note (Signed)
Pt states she started leaking fluid around 0500, some blood noted.  Had large gush in our lobby that soaked her pants.  States she is having mild uc's.  Fluid is clear. 

## 2014-10-20 NOTE — H&P (Signed)
OB Admission History and Physical    Chief Complaint: Rupture of Membranes  First Provider Initiated Contact with Patient 10/20/2014 at 0821.   HPI: Anna Wang is a 24 y.o. G1P0000 at 4722w1d who presents to maternity admissions reporting leaking clear fluid since 0500 and mild contractions. Large gush of fluid in MAU lobby. Fern pos.  Denies vaginal bleeding. Good fetal movement.   Pregnancy Course: A2GDM, glyburide. GBS pos  Past Medical History: Past Medical History  Diagnosis Date  . Medical history non-contributory     Past obstetric history: OB History  Gravida Para Term Preterm AB SAB TAB Ectopic Multiple Living  1 0 0 0 0 0 0 0 0 0     # Outcome Date GA Lbr Len/2nd Weight Sex Delivery Anes PTL Lv  1 Current              Patient Active Problem List   Diagnosis Date Noted  . Leakage of amniotic fluid 10/20/2014  . [redacted] weeks gestation of pregnancy   . Group B Streptococcus carrier, +RV culture, currently pregnant 10/05/2014  . Supervision of high risk pregnancy in third trimester 08/29/2014  . Gestational diabetes mellitus, class A2 08/29/2014   Clinic HD --> HRC Prenatal Labs  Dating LMP Blood type: O/Positive/-- (09/10 0000)  Genetic Screen Quad: normal Antibody:Negative (09/10 0000)  Anatomic US normal Rubella: Immune (09/10 0000)  GTT Third trimester: 139 3HR: 75, 143, 171, 169 RPR: Nonreactive, Nonreactive (09/10 0000)   TDaP vaccine 08/11/14 HBsAg: Negative (09/10 0000)   Flu vaccine 07/04/14 HIV: Non-reactive (09/10 0000)   GBS Positive GBS: Positive  Contraception Depo Pap:  Baby Food Breast   Circumcision Girl   Pediatrician    Support Person Reuel BoomDaniel     Past Surgical History: Past Surgical History  Procedure Laterality Date  . No past surgeries      Family History: No family history on file.  Social History: History  Substance Use Topics  . Smoking status: Never  Smoker   . Smokeless tobacco: Never Used  . Alcohol Use: No    Allergies: No Known Allergies  Meds:  Prescriptions prior to admission  Medication Sig Dispense Refill Last Dose  . glyBURIDE (DIABETA) 2.5 MG tablet Take 1 tablet (2.5 mg total) by mouth at bedtime. 30 tablet 2 Taking  . prenatal vitamin w/FE, FA (PRENATAL 1 + 1) 27-1 MG TABS tablet Take 1 tablet by mouth daily at 12 noon.   Taking    ROS:  Review of Systems  Constitutional: Negative for fever and chills.  Gastrointestinal: Positive for abdominal pain.    Physical Exam  Blood pressure 112/79, pulse 86, resp. rate 20, last menstrual period 01/26/2014. GENERAL: Well-developed, well-nourished female in no acute distress.  HEENT: normocephalic HEART: normal rate RESP: normal effort ABDOMEN: Soft, non-tender, gravid appropriate for gestational age EXTREMITIES: Nontender, no edema NEURO: alert and oriented SPECULUM EXAM: Deferred. Grossly ruptured.  Dilation: 4 Effacement (%): 70 Cervical Position: Middle Station: -2 Presentation: Vertex Exam by:: Dorrene GermanJ. Lowe RN FHT: Baseline 130 , moderate variability, accelerations present, no decelerations Contractions: q 3 mins, mild  Labs: Results for orders placed or performed during the hospital encounter of 10/20/14 (from the past 24 hour(s))  Glucose, capillary     Status: None   Collection Time: 10/20/14  9:14 AM  Result Value Ref Range   Glucose-Capillary 81 70 - 99 mg/dL    Assessment: 1. Labor: SROM, early labor 2. Fetal Wellbeing: Category I  3. Pain Control: None  4. GBS: Pos 5. 38.1 week IUP 6. A2GDM on Glyburide  Plan:  1. Admit to BS per consult with MD 2. Routine L&D orders 3. Analgesia/anesthesia PRN 4. PCN 5. CBGs Q4 hours in labor 6. Hold Glyburide since not eating in labor  [place], CNM 10/20/14 8:55AM  Attestation of Attending Supervision of Advanced Practitioner (PA/CNM/NP): Evaluation and  management procedures were performed by the Advanced Practitioner under my supervision and collaboration.  I have reviewed the Advanced Practitioner's note and chart, and I agree with the management and plan.  Jaynie Collins, MD, FACOG Attending Obstetrician & Gynecologist Faculty Practice, [facility]

## 2014-10-20 NOTE — MAU Note (Signed)
Pt registered under nerw MR # will dichage this record and reregister under correct MR.

## 2014-10-20 NOTE — MAU Note (Signed)
Pt states she started leaking fluid around 0500, some blood noted.  Had large gush in our lobby that soaked her pants.  States she is having mild uc's.  Fluid is clear.

## 2014-10-20 NOTE — Lactation Note (Signed)
This note was copied from the chart of Anna Raye Wang. Lactation Consultation Note  Patient Name: Anna Wang ZOXWR'UToday's Date: 10/20/2014 Reason for consult: Initial assessment of this mom and baby at 4 hours pp.  Mom is a primipara and her newborn has latched well since birth.  Mom and FOB both present during visit and this LC able to speak Spanish and review basic breastfeeding resources and information, inlcuding importance of frequent STS and cue feedings.  Mom says she has been shown hand expression by her nurse.  Parents verbalize understanding and decline need for interpreter at this time. Mom encouraged to feed baby 8-12 times/24 hours and with feeding cues. LC encouraged review of Baby and Me pp 9, 14 and 20-25 for STS and BF information, in [place].  LC provided [employer] Resource brochure in Spanish, and reviewed WH services and list of community and web site resources, especially [facility] website which has information available in [place]..    Maternal Data Formula Feeding for Exclusion: No Has patient been taught Hand Expression?: Yes (mom reports that her nurse has shown her how to express her milk by hand) Does the patient have breastfeeding experience prior to this delivery?: No  Feeding Feeding Type: Breast Fed Length of feed: 20 min  LATCH Score/Interventions Latch: Grasps breast easily, tongue down, lips flanged, rhythmical sucking.  Audible Swallowing: A few with stimulation Intervention(s): Skin to skin  Type of Nipple: Everted at rest and after stimulation  Comfort (Breast/Nipple): Soft / non-tender     Hold (Positioning): Assistance needed to correctly position infant at breast and maintain latch. Intervention(s): Position options  LATCH Score: 8  Lactation Tools Discussed/Used   STS, cue feedings, hand expression  Consult Status Consult Status: Follow-up Date: 10/21/14 Follow-up type: In-patient    Warrick ParisianBryant, Chrishana Spargur  [facility] 10/20/2014, 5:59 PM

## 2014-10-20 NOTE — MAU Provider Note (Signed)
Chief Complaint:  Rupture of Membranes  First Provider Initiated Contact with Patient 10/20/2014 at [phone].    HPI: Anna CellarVicenta Wang is a 24 y.o. G1P0000 at 5421w1d who presents to maternity admissions reporting leaking clear fluid since 0500 and mild contractions. Large gush of fluid in MAU lobby. Fern pos.  Denies  vaginal bleeding. Good fetal movement.   Pregnancy Course: A2GDM, glyburide. GBS pos  Past Medical History: Past Medical History  Diagnosis Date  . Medical history non-contributory     Past obstetric history: OB History  Gravida Para Term Preterm AB SAB TAB Ectopic Multiple Living  1 0 0 0 0 0 0 0 0 0     # Outcome Date GA Lbr Len/2nd Weight Sex Delivery Anes PTL Lv  1 Current               Past Surgical History: Past Surgical History  Procedure Laterality Date  . No past surgeries       Family History: No family history on file.  Social History: History  Substance Use Topics  . Smoking status: Never Smoker   . Smokeless tobacco: Never Used  . Alcohol Use: No    Allergies: No Known Allergies  Meds:  Prescriptions prior to admission  Medication Sig Dispense Refill Last Dose  . glyBURIDE (DIABETA) 2.5 MG tablet Take 1 tablet (2.5 mg total) by mouth at bedtime. 30 tablet 2 Taking  . prenatal vitamin w/FE, FA (PRENATAL 1 + 1) 27-1 MG TABS tablet Take 1 tablet by mouth daily at 12 noon.   Taking    ROS:  Review of Systems  Constitutional: Negative for fever and chills.  Gastrointestinal: Positive for abdominal pain.    Physical Exam  Blood pressure 112/79, pulse 86, resp. rate 20, last menstrual period 01/26/2014. GENERAL: Well-developed, well-nourished female in no acute distress.  HEENT: normocephalic HEART: normal rate RESP: normal effort ABDOMEN: Soft, non-tender, gravid appropriate for gestational age EXTREMITIES: Nontender, no edema NEURO: alert and oriented SPECULUM EXAM: Deferred. Grossly ruptured.   Dilation: 4 Effacement (%):  70 Cervical Position: Middle Station: -2 Presentation: Vertex Exam by:: Anna GermanJ. Lowe RN FHT:  Baseline 130 , moderate variability, accelerations present, no decelerations Contractions: q 3 mins, mild   Labs: NA  Imaging:  NA   Assessment: 1. Labor: SROM, early labor 2. Fetal Wellbeing: Category I  3. Pain Control: None 4. GBS: Pos 5. 38.1 week IUP 6. A2GDM on Glyburide  Plan:  1. Admit to BS per consult with MD 2. Routine L&D orders 3. Analgesia/anesthesia PRN  4. PCN 5. CBGs Q4 hours in labor 6. Hold Glyburide since not eating in labor  [place] [name], [place] 10/20/2014 8:55 AM

## 2014-10-20 NOTE — H&P (Signed)
H&P Chief Complaint: Rupture of Membranes    HPI: Anna Wang is a 24 y.o. G1P0000 at 5152w1d who presents to maternity admissions reporting leaking clear fluid since 0500 and mild contractions. Grossly ruptured while in MAU. Fern pos.   Pregnancy Course: A2GDM, glyburide. GBS pos  Past Medical History: Past Medical History  Diagnosis Date  . Medical history non-contributory     Past obstetric history: OB History  Gravida Para Term Preterm AB SAB TAB Ectopic Multiple Living  1 0 0 0 0 0 0 0 0 0     # Outcome Date GA Lbr Len/2nd Weight Sex Delivery Anes PTL Lv  1 Current               Past Surgical History: Past Surgical History  Procedure Laterality Date  . No past surgeries      Family History: No family history on file.  Social History: History  Substance Use Topics  . Smoking status: Never Smoker   . Smokeless tobacco: Never Used  . Alcohol Use: No    Allergies: No Known Allergies  Meds:  Prescriptions prior to admission  Medication Sig Dispense Refill Last Dose  . glyBURIDE (DIABETA) 2.5 MG tablet Take 1 tablet (2.5 mg total) by mouth at bedtime. 30 tablet 2 Taking  . prenatal vitamin w/FE, FA (PRENATAL 1 + 1) 27-1 MG TABS tablet Take 1 tablet by mouth daily at 12 noon.   Taking    ROS:  Review of Systems  Constitutional: Negative for fever and chills.  Gastrointestinal: Positive for abdominal pain.    Physical Exam  Blood pressure 112/79, pulse 86, resp. rate 20, last menstrual period 01/26/2014. GENERAL: Well-developed, well-nourished female in no acute distress.  HEENT: normocephalic HEART: normal rate RESP: normal effort ABDOMEN: Soft, non-tender, gravid appropriate for gestational age EXTREMITIES: Nontender, no edema NEURO: alert and oriented SPECULUM EXAM: Deferred. Grossly ruptured.  Dilation: 4 Effacement (%): 70 Cervical  Position: Middle Station: -2 Presentation: Vertex Exam by:: Dorrene GermanJ. Lowe RN FHT: Baseline 130 , moderate variability, accelerations present, no decelerations Contractions: q 3 mins, mild  Labs: NA  Imaging:  NA    Clinic HD --> HRC Prenatal Labs  Dating LMP Blood type: O/Positive/-- (09/10 0000)  Genetic Screen Quad: normal Antibody:Negative (09/10 0000)  Anatomic US normal Rubella: Immune (09/10 0000)  GTT Early:               Third trimester: 139 3HR: 75, 143, 171, 169 RPR: Nonreactive, Nonreactive (09/10 0000)   TDaP vaccine 08/11/14 HBsAg: Negative (09/10 0000)   Flu vaccine 07/04/14 HIV: Non-reactive (09/10 0000)   GBS Positive GBS: Positive  Contraception Depo Pap:  Baby Food Breast   Circumcision Girl   Pediatrician    Support Person Reuel BoomDaniel      Assessment: 1. Labor: SROM, early labor 2. Fetal Wellbeing: Category I  3. Pain Control: None 4. GBS: Pos 5. 38.1 week IUP 6. A2GDM on Glyburide  Plan:  1. Admit to BS per consult with MD 2. Routine L&D orders 3. Analgesia/anesthesia PRN 4. PCN 5. CBGs Q4 hours in labor 6. Hold Glyburide since not eating in labor

## 2014-10-21 ENCOUNTER — Encounter (HOSPITAL_COMMUNITY): Payer: Self-pay | Admitting: *Deleted

## 2014-10-21 LAB — RPR: RPR Ser Ql: NONREACTIVE

## 2014-10-21 NOTE — Progress Notes (Signed)
Post Partum Day 1 Subjective: Pt is a 24y/o G1P1001, with a history of gestational diabetes controlled by Glyburide, PPD#1 after a SVD @1340 . She reports no complaints overnight. She is able to have a BM and void well. No pain overnight. Feeding method: breast. Contraception: Depo. Pt had no questions.  Objective: Blood pressure 101/63, pulse 97, temperature 98.9 F (37.2 C), temperature source Oral, resp. rate 18, height 5' 2.21" (1.58 m), weight 69.854 kg (154 lb), last menstrual period 01/26/2014, unknown if currently breastfeeding.  Physical Exam:  General: alert, cooperative and no distress Lochia: appropriate Uterine Fundus: soft DVT Evaluation: No evidence of DVT seen on physical exam.   Recent Labs  10/20/14 0915  HGB 12.6  HCT 36.0    Assessment/Plan: Plan for discharge tomorrow     LOS: 1 day    Eston Estersyler M Corin Tilly, Student-PA 10/21/2014 at 7:39 AM

## 2014-10-21 NOTE — Lactation Note (Signed)
This note was copied from the chart of Anna Wang. Lactation Consultation Note  [name] Interpreter # [phone] Mother nipple sore and is wearing comfort gels. Discussed getting depth to decrease soreness. Mother denies other problems or questions.  Patient Name: Anna Wang [id number]'HToday's Date: 10/21/2014 Reason for consult: Follow-up assessment   Maternal Data    Feeding    LATCH Score/Interventions                      Lactation Tools Discussed/Used     Consult Status Consult Status: Follow-up Date: 10/22/14 Follow-up type: In-patient    Dahlia ByesBerkelhammer, Soriah Leeman [address] [place] [employer] 10/21/2014, 3:55 PM

## 2014-10-21 NOTE — Progress Notes (Signed)
I stopped by patient's room to check on her needs, I ordered her dinner and breakfast, and I also assisted BankerHolly RN and Bett RN with some questions during the shift change, by Orlan LeavensViria Alvarez Spanish Interpreter

## 2014-10-21 NOTE — Progress Notes (Signed)
UR chart review completed.  

## 2014-10-22 ENCOUNTER — Encounter (HOSPITAL_COMMUNITY): Payer: Self-pay

## 2014-10-22 NOTE — Discharge Instructions (Signed)

## 2014-10-22 NOTE — Lactation Note (Signed)
This note was copied from the chart of Anna Ninoska Wang. Lactation Consultation Note: [facility] interpreter [phone] Mom reports baby has been nursing a lot. Reports nipples are a little sore because she has been nursing so much.Reports pain only at the beginning of the feeding then eases off. Asking about giving formula at night. Suggested waiting until milk supply increases and baby will not be nursing as much as volume increases. Mom reports that breasts are feeling heavier today. Does not have pump at home. Manual pump given with instructions for use and cleaning. Discussed engorgement prevention and treatment. No questions at present.   Patient Name: Anna Wang [id number]'JToday's Date: 10/22/2014 Reason for consult: Follow-up assessment   Maternal Data Formula Feeding for Exclusion: No Has patient been taught Hand Expression?: Yes Does the patient have breastfeeding experience prior to this delivery?: No  Feeding   LATCH Score/Interventions                      Lactation Tools Discussed/Used     Consult Status Consult Status: Complete    Pamelia HoitWeeks, Prima Rayner D 10/22/2014, 11:00 AM

## 2014-10-22 NOTE — Discharge Summary (Signed)
Obstetric Discharge Summary Reason for Admission: onset of labor and rupture of membranes Prenatal Procedures: NST and ultrasound Intrapartum Procedures: spontaneous vaginal delivery and GBS prophylaxis Postpartum Procedures: none Complications-Operative and Postpartum: bilateral labial laceration   Needs to have postpartum GTT at f/u appt.  HEMOGLOBIN  Date Value Ref Range Status  10/20/2014 12.6 12.0 - 15.0 g/dL Final  45/40/981112/11/2013 91.411.1 g/dL Final   HCT  Date Value Ref Range Status  10/20/2014 36.0 36.0 - 46.0 % Final  08/11/2014 34 % Final    Physical Exam:  General: alert, cooperative, appears stated age and no distress Lochia: appropriate Uterine Fundus: firm Incision: n/a DVT Evaluation: No evidence of DVT seen on physical exam. No cords or calf tenderness. No significant calf/ankle edema.  Discharge Diagnoses: Term Pregnancy-delivered  Discharge Information: Date: 10/22/2014 Activity: pelvic rest Diet: routine Medications: PNV Condition: stable Instructions: refer to practice specific booklet Discharge to: home Follow-up Information    [facility] Schedule an appointment as soon as possible for a visit in 6 weeks.   Specialty:  Obstetrics and Gynecology   Contact information:   [address] [place] [id number] [phone]      Newborn Data: Live born female  Birth Weight: 6 lb 5.8 oz (2885 g) APGAR: 8, 8  Home with mother.  Kathee DeltonMcKeag, Ian D 10/22/2014, 9:02 AM   I have seen and examined this patient and I agree with the above. Cam HaiSHAW, Alexia Dinger CNM 9:07 AM 10/22/2014

## 2014-10-24 ENCOUNTER — Other Ambulatory Visit: Payer: Self-pay

## 2014-10-26 ENCOUNTER — Inpatient Hospital Stay (HOSPITAL_COMMUNITY): Admission: RE | Admit: 2014-10-26 | Payer: Self-pay | Source: Ambulatory Visit

## 2014-11-02 ENCOUNTER — Encounter: Payer: Self-pay | Admitting: Obstetrics & Gynecology

## 2014-11-28 ENCOUNTER — Encounter: Payer: Self-pay | Admitting: Family Medicine

## 2014-11-28 ENCOUNTER — Ambulatory Visit (INDEPENDENT_AMBULATORY_CARE_PROVIDER_SITE_OTHER): Payer: Self-pay | Admitting: Family Medicine

## 2014-11-28 VITALS — BP 108/62 | HR 61 | Wt 137.8 lb

## 2014-11-28 DIAGNOSIS — O24419 Gestational diabetes mellitus in pregnancy, unspecified control: Secondary | ICD-10-CM

## 2014-11-28 MED ORDER — NORGESTIMATE-ETH ESTRADIOL 0.25-35 MG-MCG PO TABS: 1.0000 | ORAL_TABLET | Freq: Every day | ORAL | Status: DC

## 2014-11-28 MED ORDER — HYDROCORTISONE 2.5 % RE CREA: 1.0000 "application " | TOPICAL_CREAM | Freq: Two times a day (BID) | RECTAL | Status: DC

## 2014-11-28 NOTE — Patient Instructions (Signed)
1. tomar mas agua 2. Comer mas fibre 3. comenzar Miralax or colace 4. comenzar crema anusol 5. Sitz baths/lavando con agua de manzanilla      Hemorroides  (Hemorrhoids)  Las hemorroides son venas inflamadas (hinchadas) alrededor del recto o del ano. Las hemorroides causan dolor, picazn, sangrado o irritacin.  CUIDADOS EN EL HOGAR   Consuma alimentos con fibra, como cereales integrales, legumbres, frutos secos, frutas y verduras. Pregntele a su mdico acerca de tomar productos con fibra aadida en ellos (suplementos defibra).  Beba gran cantidad de lquido para mantener el pis (orina) de tono claro o amarillo plido.  Haga ejercicio a menudo.  Vaya al bao cuando sienta la necesidad de ir de cuerpo. No espere.  Evite hacer fuerza al ir de cuerpo Contractor(mover el intestino).Shan Levans.  Mantenga la zona anal limpia y seca. Use papel higinico mojado o toallitas de papel humedecidas.  Puede utilizar o Contractoraplicar segn las indicaciones las cremas recetadas y los medicamentos que se aplican en el ano (supositorio anal )  Slo tome los medicamentos que le indique el mdico.  Tome un bao de agua tibia (bao de asiento) durante 15 a 20 minutos para Engineer, materialsaliviar el dolor. Repita 3  4 veces por da.  Coloque una bolsa de hielo sobre la zona si le duele o est inflamada. Use compresas de hielo Marriottentre los baos de agua tibia.  Ponga el hielo en una bolsa plstica.  Colquese una toalla entre la piel y la bolsa de hielo.  Deje el hielo en el lugar durante 15 a 20 minutos, 3 a 4 veces por da.  No utilice una almohada en forma de aro ni se siente en el inodoro durante perodos prolongados. SOLICITE AYUDA DE INMEDIATO SI:   Aumenta el dolor y no puede controlarlo con la medicacin o con Pharmacist, communityun tratamiento.  Tiene una hemorragia que no se detiene.  Tiene dificultado o no puede ir de cuerpo Contractor(mover el intestino).  Siente dolor o tiene inflamacin fuera de la zona de las hemorroides. ASEGRESE DE QUE:    Comprende estas instrucciones.  Controlar su enfermedad.  Solicitar ayuda de inmediato si no mejora o si empeora. Document Released: 12/21/2012 [facility] Patient Information 2015 [employer] [place]. This information is not intended to replace advice given to you by your health care provider. Make sure you discuss any questions you have with your health care provider.

## 2014-11-28 NOTE — Progress Notes (Addendum)
Patient ID: Anna Wang, female   DOB: 01/04/1991, 24 y.o.   MRN: [id number] Subjective:     Anna Wang is a 24 y.o. female who presents for a postpartum visit. She is 5 weeks postpartum following a spontaneous vaginal delivery. I have fully reviewed the prenatal and intrapartum course. The delivery was at 38 gestational weeks. Outcome: spontaneous vaginal delivery. Anesthesia: none. Postpartum course has been complicated by hemorrhoids. Baby's course has been uncomplicated. Baby is feeding by breast. Bleeding no bleeding. Bowel function is abnormal with constipation initially, some pain, some bleeding.   This has improved since discomfort initially with drinking chamomile tea and chamomile tea sitz baths. Bladder function is normal. Patient is not sexually active. Contraception method is considering depo. Postpartum depression screening: negative.  The following portions of the patient's history were reviewed and updated as appropriate: allergies, current medications, past family history, past medical history, past social history, past surgical history and problem list.  Review of Systems Pertinent items are noted in HPI.   Objective:    BP 108/62 mmHg  Pulse 61  Wt 137 lb 12.8 oz (62.506 kg)  Breastfeeding? Yes  General:  alert and cooperative   Breasts:  not examined  Lungs: normal effort  Heart:  normal rate  Abdomen: soft, non-tender; bowel sounds normal; no masses,  no organomegaly   Vulva:  normal  Vagina: normal vagina  Rectal Exam: no external hemorrhoids, internal hemorrhoids noted        Assessment:     normal postpartum exam with internal hemorrhoids. Pap smear not done at today's visit.   Plan:    1. Contraception: depo, will call health dept 2. A2DM: 2h gtt per protocol.  To be done as lab-only visit 3. Follow up as needed.   4. Hemorrhoids: discussed water intake/fiber/stool/sitz baths softeners, rx anusol cream

## 2014-11-28 NOTE — Progress Notes (Signed)
Pt desires depo for birth control. [facility]

## 2014-11-30 ENCOUNTER — Telehealth: Payer: Self-pay | Admitting: *Deleted

## 2014-11-30 NOTE — Telephone Encounter (Signed)
Contacted patient with Albertina SenegalMarly Wang Spanish Interpreter, results given.  Pt can come for 2 hour gtt on 3/29.  Instructions given. Pt verbalizes understanding.

## 2014-11-30 NOTE — Telephone Encounter (Signed)
-----   Message from Fredirick LatheKristy Acosta, MD sent at 11/29/2014  1:47 PM EDT ----- She needs 2h gtt per protocol, please have pt come back for testing.  Was seen late yesterday afternoon for postpartum, not done/ordered.  Perry MountACOSTA,KRISTY ROCIO, MD

## 2014-12-06 ENCOUNTER — Other Ambulatory Visit: Payer: Self-pay

## 2014-12-06 DIAGNOSIS — O24439 Gestational diabetes mellitus in the puerperium, unspecified control: Secondary | ICD-10-CM

## 2014-12-07 LAB — GLUCOSE TOLERANCE, 2 HOURS
GLUCOSE, FASTING: 91 mg/dL (ref 70–99)
Glucose, 2 hour: 84 mg/dL (ref 70–139)

## 2014-12-08 ENCOUNTER — Encounter: Payer: Self-pay | Admitting: Family Medicine

## 2014-12-12 ENCOUNTER — Telehealth: Payer: Self-pay

## 2014-12-12 NOTE — Telephone Encounter (Signed)
-----   Message from Levie HeritageJacob J Stinson, DO sent at 12/08/2014  1:45 PM EDT ----- 2hr GTT negative - please let pt know

## 2014-12-12 NOTE — Telephone Encounter (Signed)
Called patient with interpreter Anna NormanBlanca Linder and informed her of results. Patient verbalized understanding. No questions or concerns.

## 2015-04-26 ENCOUNTER — Encounter (HOSPITAL_COMMUNITY): Payer: Self-pay

## 2015-04-26 ENCOUNTER — Encounter (HOSPITAL_COMMUNITY): Payer: Self-pay | Admitting: *Deleted

## 2015-08-25 ENCOUNTER — Encounter (HOSPITAL_COMMUNITY): Payer: Self-pay | Admitting: *Deleted

## 2018-07-11 LAB — OB RESULTS CONSOLE RPR: RPR: NONREACTIVE

## 2018-07-11 LAB — OB RESULTS CONSOLE HEPATITIS B SURFACE ANTIGEN: Hepatitis B Surface Ag: NEGATIVE

## 2018-07-11 LAB — OB RESULTS CONSOLE HIV ANTIBODY (ROUTINE TESTING): HIV: NONREACTIVE

## 2018-07-11 LAB — OB RESULTS CONSOLE RUBELLA ANTIBODY, IGM: Rubella: IMMUNE

## 2018-08-31 ENCOUNTER — Other Ambulatory Visit: Payer: Self-pay

## 2018-09-07 ENCOUNTER — Other Ambulatory Visit (HOSPITAL_COMMUNITY): Payer: Self-pay | Admitting: Obstetrics and Gynecology

## 2018-09-07 DIAGNOSIS — Z363 Encounter for antenatal screening for malformations: Secondary | ICD-10-CM

## 2018-09-08 ENCOUNTER — Encounter (HOSPITAL_COMMUNITY): Payer: Self-pay | Admitting: *Deleted

## 2018-09-08 ENCOUNTER — Ambulatory Visit (HOSPITAL_COMMUNITY)
Admission: RE | Admit: 2018-09-08 | Discharge: 2018-09-08 | Disposition: A | Discharge status: Home / Self Care | Payer: Self-pay | Source: Ambulatory Visit | Attending: Obstetrics and Gynecology | Admitting: Obstetrics and Gynecology

## 2018-09-08 ENCOUNTER — Other Ambulatory Visit (HOSPITAL_COMMUNITY): Payer: Self-pay | Admitting: *Deleted

## 2018-09-08 DIAGNOSIS — Z3A21 21 weeks gestation of pregnancy: Secondary | ICD-10-CM

## 2018-09-08 DIAGNOSIS — O358XX0 Maternal care for other (suspected) fetal abnormality and damage, not applicable or unspecified: Secondary | ICD-10-CM

## 2018-09-08 DIAGNOSIS — O09292 Supervision of pregnancy with other poor reproductive or obstetric history, second trimester: Secondary | ICD-10-CM

## 2018-09-08 DIAGNOSIS — O359XX0 Maternal care for (suspected) fetal abnormality and damage, unspecified, not applicable or unspecified: Secondary | ICD-10-CM

## 2018-09-08 DIAGNOSIS — O35EXX0 Maternal care for other (suspected) fetal abnormality and damage, fetal genitourinary anomalies, not applicable or unspecified: Secondary | ICD-10-CM

## 2018-09-08 DIAGNOSIS — Z363 Encounter for antenatal screening for malformations: Secondary | ICD-10-CM

## 2018-09-09 NOTE — L&D Delivery Note (Addendum)
Delivery Note At 1:15 PM a viable female was delivered via Vaginal, Spontaneous (Presentation:LOA ; one loose nuchal cord delivered through ).  APGAR: 9, 9; weight  .   Placenta status: delivered intact, three vessle cord with no complications.   Anesthesia:  epidural Episiotomy: None Lacerations: 2nd degree Suture Repair: 3.0 vicryl Est. Blood Loss (mL):  250  Mom to postpartum.  Baby to Couplet care / Skin to Skin.  Charlesetta Garibaldi Kooistra 01/11/2019, 1:51 PM

## 2018-11-03 ENCOUNTER — Other Ambulatory Visit (HOSPITAL_COMMUNITY): Payer: Self-pay | Admitting: *Deleted

## 2018-11-03 ENCOUNTER — Ambulatory Visit (HOSPITAL_COMMUNITY)
Admission: RE | Admit: 2018-11-03 | Discharge: 2018-11-03 | Disposition: A | Discharge status: Home / Self Care | Payer: Self-pay | Source: Ambulatory Visit | Attending: Obstetrics and Gynecology | Admitting: Obstetrics and Gynecology

## 2018-11-03 DIAGNOSIS — O359XX0 Maternal care for (suspected) fetal abnormality and damage, unspecified, not applicable or unspecified: Secondary | ICD-10-CM

## 2018-11-03 DIAGNOSIS — O09293 Supervision of pregnancy with other poor reproductive or obstetric history, third trimester: Secondary | ICD-10-CM

## 2018-11-03 DIAGNOSIS — O358XX0 Maternal care for other (suspected) fetal abnormality and damage, not applicable or unspecified: Secondary | ICD-10-CM

## 2018-11-03 DIAGNOSIS — O35EXX0 Maternal care for other (suspected) fetal abnormality and damage, fetal genitourinary anomalies, not applicable or unspecified: Secondary | ICD-10-CM

## 2018-11-03 DIAGNOSIS — Z3A29 29 weeks gestation of pregnancy: Secondary | ICD-10-CM

## 2018-11-03 DIAGNOSIS — Z362 Encounter for other antenatal screening follow-up: Secondary | ICD-10-CM

## 2018-11-24 ENCOUNTER — Ambulatory Visit (HOSPITAL_COMMUNITY)
Admission: RE | Admit: 2018-11-24 | Discharge: 2018-11-24 | Disposition: A | Discharge status: Home / Self Care | Payer: Self-pay | Source: Ambulatory Visit | Attending: Maternal & Fetal Medicine | Admitting: Maternal & Fetal Medicine

## 2018-11-24 ENCOUNTER — Other Ambulatory Visit: Payer: Self-pay

## 2018-11-24 ENCOUNTER — Encounter (HOSPITAL_COMMUNITY): Payer: Self-pay

## 2018-11-24 ENCOUNTER — Ambulatory Visit (HOSPITAL_COMMUNITY): Payer: Self-pay | Admitting: *Deleted

## 2018-11-24 VITALS — BP 113/69 | HR 77 | Temp 98.4°F | Wt 171.4 lb

## 2018-11-24 DIAGNOSIS — Z3A32 32 weeks gestation of pregnancy: Secondary | ICD-10-CM

## 2018-11-24 DIAGNOSIS — O359XX0 Maternal care for (suspected) fetal abnormality and damage, unspecified, not applicable or unspecified: Secondary | ICD-10-CM

## 2018-11-24 DIAGNOSIS — O09293 Supervision of pregnancy with other poor reproductive or obstetric history, third trimester: Secondary | ICD-10-CM

## 2018-11-24 DIAGNOSIS — O35EXX0 Maternal care for other (suspected) fetal abnormality and damage, fetal genitourinary anomalies, not applicable or unspecified: Secondary | ICD-10-CM

## 2018-11-24 DIAGNOSIS — O358XX0 Maternal care for other (suspected) fetal abnormality and damage, not applicable or unspecified: Secondary | ICD-10-CM | POA: Insufficient documentation

## 2018-12-01 ENCOUNTER — Ambulatory Visit (HOSPITAL_COMMUNITY)
Admission: RE | Admit: 2018-12-01 | Discharge: 2018-12-01 | Disposition: A | Discharge status: Home / Self Care | Payer: Self-pay | Source: Ambulatory Visit | Attending: Maternal & Fetal Medicine | Admitting: Maternal & Fetal Medicine

## 2018-12-01 ENCOUNTER — Ambulatory Visit (HOSPITAL_COMMUNITY): Payer: Self-pay | Admitting: *Deleted

## 2018-12-01 ENCOUNTER — Other Ambulatory Visit: Payer: Self-pay

## 2018-12-01 ENCOUNTER — Encounter (HOSPITAL_COMMUNITY): Payer: Self-pay

## 2018-12-01 VITALS — BP 98/61 | HR 78 | Temp 98.5°F

## 2018-12-01 DIAGNOSIS — Z3A33 33 weeks gestation of pregnancy: Secondary | ICD-10-CM

## 2018-12-01 DIAGNOSIS — O359XX0 Maternal care for (suspected) fetal abnormality and damage, unspecified, not applicable or unspecified: Secondary | ICD-10-CM | POA: Insufficient documentation

## 2018-12-01 DIAGNOSIS — O09293 Supervision of pregnancy with other poor reproductive or obstetric history, third trimester: Secondary | ICD-10-CM

## 2018-12-01 DIAGNOSIS — Z362 Encounter for other antenatal screening follow-up: Secondary | ICD-10-CM

## 2018-12-01 DIAGNOSIS — O358XX0 Maternal care for other (suspected) fetal abnormality and damage, not applicable or unspecified: Secondary | ICD-10-CM | POA: Insufficient documentation

## 2018-12-01 DIAGNOSIS — O35EXX0 Maternal care for other (suspected) fetal abnormality and damage, fetal genitourinary anomalies, not applicable or unspecified: Secondary | ICD-10-CM

## 2018-12-21 LAB — OB RESULTS CONSOLE GC/CHLAMYDIA
Chlamydia: NEGATIVE
Gonorrhea: NEGATIVE

## 2018-12-21 LAB — OB RESULTS CONSOLE GBS: GBS: NEGATIVE

## 2019-01-11 ENCOUNTER — Encounter (HOSPITAL_COMMUNITY): Payer: Self-pay

## 2019-01-11 ENCOUNTER — Other Ambulatory Visit: Payer: Self-pay

## 2019-01-11 ENCOUNTER — Inpatient Hospital Stay (HOSPITAL_COMMUNITY)
Admission: AD | Admit: 2019-01-11 | Discharge: 2019-01-12 | DRG: 807 | Disposition: A | Discharge status: Home / Self Care | Payer: Medicaid Other | Attending: Obstetrics and Gynecology | Admitting: Obstetrics and Gynecology

## 2019-01-11 ENCOUNTER — Inpatient Hospital Stay (HOSPITAL_COMMUNITY): Payer: Medicaid Other | Admitting: Anesthesiology

## 2019-01-11 DIAGNOSIS — Z3A39 39 weeks gestation of pregnancy: Secondary | ICD-10-CM

## 2019-01-11 DIAGNOSIS — O4292 Full-term premature rupture of membranes, unspecified as to length of time between rupture and onset of labor: Secondary | ICD-10-CM | POA: Diagnosis present

## 2019-01-11 DIAGNOSIS — O4290 Premature rupture of membranes, unspecified as to length of time between rupture and onset of labor, unspecified weeks of gestation: Secondary | ICD-10-CM | POA: Diagnosis present

## 2019-01-11 DIAGNOSIS — O99214 Obesity complicating childbirth: Secondary | ICD-10-CM | POA: Diagnosis present

## 2019-01-11 LAB — CBC
HCT: 37.4 % (ref 36.0–46.0)
Hemoglobin: 13 g/dL (ref 12.0–15.0)
MCH: 30.5 pg (ref 26.0–34.0)
MCHC: 34.8 g/dL (ref 30.0–36.0)
MCV: 87.8 fL (ref 80.0–100.0)
Platelets: 164 10*3/uL (ref 150–400)
RBC: 4.26 MIL/uL (ref 3.87–5.11)
RDW: 13.3 % (ref 11.5–15.5)
WBC: 8.1 10*3/uL (ref 4.0–10.5)
nRBC: 0 % (ref 0.0–0.2)

## 2019-01-11 LAB — RPR: RPR Ser Ql: NONREACTIVE

## 2019-01-11 LAB — POCT FERN TEST: POCT Fern Test: POSITIVE

## 2019-01-11 MED ORDER — PHENYLEPHRINE 40 MCG/ML (10ML) SYRINGE FOR IV PUSH (FOR BLOOD PRESSURE SUPPORT): 80.0000 ug | PREFILLED_SYRINGE | INTRAVENOUS | Status: DC | PRN

## 2019-01-11 MED ORDER — DOCUSATE SODIUM 100 MG PO CAPS
100.0000 mg | ORAL_CAPSULE | Freq: Two times a day (BID) | ORAL | Status: DC
  Administered 2019-01-11 – 2019-01-12 (×2): 100 mg via ORAL
  Filled 2019-01-11 (×2): qty 1

## 2019-01-11 MED ORDER — DIPHENHYDRAMINE HCL 50 MG/ML IJ SOLN: 12.5000 mg | INTRAMUSCULAR | Status: DC | PRN

## 2019-01-11 MED ORDER — OXYCODONE-ACETAMINOPHEN 5-325 MG PO TABS: 1.0000 | ORAL_TABLET | ORAL | Status: DC | PRN

## 2019-01-11 MED ORDER — TETANUS-DIPHTH-ACELL PERTUSSIS 5-2.5-18.5 LF-MCG/0.5 IM SUSP: 0.5000 mL | Freq: Once | INTRAMUSCULAR | Status: DC

## 2019-01-11 MED ORDER — LIDOCAINE HCL (PF) 1 % IJ SOLN
INTRAMUSCULAR | Status: DC | PRN
  Administered 2019-01-11: 5 mL via EPIDURAL
  Administered 2019-01-11: 4 mL via EPIDURAL

## 2019-01-11 MED ORDER — SENNOSIDES-DOCUSATE SODIUM 8.6-50 MG PO TABS
2.0000 | ORAL_TABLET | ORAL | Status: DC
  Administered 2019-01-11: 2 via ORAL
  Filled 2019-01-11: qty 2

## 2019-01-11 MED ORDER — SODIUM CHLORIDE (PF) 0.9 % IJ SOLN
INTRAMUSCULAR | Status: DC | PRN
  Administered 2019-01-11: 14 mL/h via EPIDURAL

## 2019-01-11 MED ORDER — OXYCODONE-ACETAMINOPHEN 5-325 MG PO TABS: 2.0000 | ORAL_TABLET | ORAL | Status: DC | PRN

## 2019-01-11 MED ORDER — ACETAMINOPHEN 325 MG PO TABS: 650.0000 mg | ORAL_TABLET | ORAL | Status: DC | PRN

## 2019-01-11 MED ORDER — EPHEDRINE 5 MG/ML INJ: 10.0000 mg | INTRAVENOUS | Status: DC | PRN

## 2019-01-11 MED ORDER — ONDANSETRON HCL 4 MG/2ML IJ SOLN: 4.0000 mg | Freq: Four times a day (QID) | INTRAMUSCULAR | Status: DC | PRN

## 2019-01-11 MED ORDER — WITCH HAZEL-GLYCERIN EX PADS: 1.0000 "application " | MEDICATED_PAD | CUTANEOUS | Status: DC | PRN

## 2019-01-11 MED ORDER — OXYTOCIN BOLUS FROM INFUSION
500.0000 mL | Freq: Once | INTRAVENOUS | Status: AC
  Administered 2019-01-11: 500 mL via INTRAVENOUS

## 2019-01-11 MED ORDER — FENTANYL-BUPIVACAINE-NACL 0.5-0.125-0.9 MG/250ML-% EP SOLN: 12.0000 mL/h | EPIDURAL | Status: DC | PRN

## 2019-01-11 MED ORDER — SIMETHICONE 80 MG PO CHEW: 80.0000 mg | CHEWABLE_TABLET | ORAL | Status: DC | PRN

## 2019-01-11 MED ORDER — LIDOCAINE HCL (PF) 1 % IJ SOLN: 30.0000 mL | INTRAMUSCULAR | Status: DC | PRN

## 2019-01-11 MED ORDER — DIBUCAINE (PERIANAL) 1 % EX OINT: 1.0000 "application " | TOPICAL_OINTMENT | CUTANEOUS | Status: DC | PRN

## 2019-01-11 MED ORDER — DIPHENHYDRAMINE HCL 25 MG PO CAPS: 25.0000 mg | ORAL_CAPSULE | Freq: Four times a day (QID) | ORAL | Status: DC | PRN

## 2019-01-11 MED ORDER — IBUPROFEN 600 MG PO TABS
600.0000 mg | ORAL_TABLET | Freq: Four times a day (QID) | ORAL | Status: DC
  Administered 2019-01-11 – 2019-01-12 (×5): 600 mg via ORAL
  Filled 2019-01-11 (×5): qty 1

## 2019-01-11 MED ORDER — COCONUT OIL OIL
1.0000 "application " | TOPICAL_OIL | Status: DC | PRN
  Administered 2019-01-12: 1 via TOPICAL

## 2019-01-11 MED ORDER — FENTANYL-BUPIVACAINE-NACL 0.5-0.125-0.9 MG/250ML-% EP SOLN
EPIDURAL | Status: AC
  Filled 2019-01-11: qty 250

## 2019-01-11 MED ORDER — FENTANYL CITRATE (PF) 100 MCG/2ML IJ SOLN
100.0000 ug | INTRAMUSCULAR | Status: DC | PRN
  Administered 2019-01-11 (×3): 100 ug via INTRAVENOUS
  Filled 2019-01-11 (×2): qty 2

## 2019-01-11 MED ORDER — LACTATED RINGERS IV SOLN
500.0000 mL | Freq: Once | INTRAVENOUS | Status: AC
  Administered 2019-01-11: 11:00:00 500 mL via INTRAVENOUS

## 2019-01-11 MED ORDER — LACTATED RINGERS IV SOLN
INTRAVENOUS | Status: DC
  Administered 2019-01-11 (×3): via INTRAVENOUS

## 2019-01-11 MED ORDER — ZOLPIDEM TARTRATE 5 MG PO TABS: 5.0000 mg | ORAL_TABLET | Freq: Every evening | ORAL | Status: DC | PRN

## 2019-01-11 MED ORDER — ONDANSETRON HCL 4 MG PO TABS: 4.0000 mg | ORAL_TABLET | ORAL | Status: DC | PRN

## 2019-01-11 MED ORDER — ACETAMINOPHEN 325 MG PO TABS
650.0000 mg | ORAL_TABLET | ORAL | Status: DC | PRN
  Administered 2019-01-12: 650 mg via ORAL
  Filled 2019-01-11: qty 2

## 2019-01-11 MED ORDER — LACTATED RINGERS IV SOLN: 500.0000 mL | INTRAVENOUS | Status: DC | PRN

## 2019-01-11 MED ORDER — SOD CITRATE-CITRIC ACID 500-334 MG/5ML PO SOLN: 30.0000 mL | ORAL | Status: DC | PRN

## 2019-01-11 MED ORDER — ONDANSETRON HCL 4 MG/2ML IJ SOLN: 4.0000 mg | INTRAMUSCULAR | Status: DC | PRN

## 2019-01-11 MED ORDER — FENTANYL CITRATE (PF) 100 MCG/2ML IJ SOLN
INTRAMUSCULAR | Status: AC
  Filled 2019-01-11: qty 2

## 2019-01-11 MED ORDER — OXYTOCIN 40 UNITS IN NORMAL SALINE INFUSION - SIMPLE MED
2.5000 [IU]/h | INTRAVENOUS | Status: DC
  Filled 2019-01-11: qty 1000

## 2019-01-11 MED ORDER — PRENATAL MULTIVITAMIN CH
1.0000 | ORAL_TABLET | Freq: Every day | ORAL | Status: DC
  Administered 2019-01-12: 1 via ORAL
  Filled 2019-01-11: qty 1

## 2019-01-11 MED ORDER — BENZOCAINE-MENTHOL 20-0.5 % EX AERO
1.0000 "application " | INHALATION_SPRAY | CUTANEOUS | Status: DC | PRN
  Administered 2019-01-11: 1 via TOPICAL
  Filled 2019-01-11: qty 56

## 2019-01-11 NOTE — Anesthesia Preprocedure Evaluation (Signed)
Anesthesia Evaluation  Patient identified by MRN, date of birth, ID band Patient awake    Reviewed: Allergy & Precautions, NPO status , Patient's Chart, lab work & pertinent test results  History of Anesthesia Complications Negative for: history of anesthetic complications  Airway Mallampati: II  TM Distance: >3 FB Neck ROM: Full    Dental   Pulmonary neg pulmonary ROS,    breath sounds clear to auscultation       Cardiovascular negative cardio ROS   Rhythm:Regular Rate:Normal     Neuro/Psych negative neurological ROS  negative psych ROS   GI/Hepatic negative GI ROS, Neg liver ROS,   Endo/Other  diabetes, Gestational Obesity   Renal/GU negative Renal ROS     Musculoskeletal negative musculoskeletal ROS (+)   Abdominal   Peds  Hematology negative hematology ROS (+)   Anesthesia Other Findings   Reproductive/Obstetrics (+) Pregnancy                             Anesthesia Physical Anesthesia Plan  ASA: II  Anesthesia Plan: Epidural   Post-op Pain Management:    Induction:   PONV Risk Score and Plan: 2 and Treatment may vary due to age or medical condition  Airway Management Planned: Natural Airway  Additional Equipment: None  Intra-op Plan:   Post-operative Plan:   Informed Consent: I have reviewed the patients History and Physical, chart, labs and discussed the procedure including the risks, benefits and alternatives for the proposed anesthesia with the patient or authorized representative who has indicated his/her understanding and acceptance.       Plan Discussed with: Anesthesiologist  Anesthesia Plan Comments: (Labs reviewed. Platelets acceptable, patient not taking any blood thinning medications. Per RN, FHR tracing reported to be stable enough for sitting procedure. Risks and benefits discussed with patient, including PDPH, backache, epidural hematoma, failed  epidural, allergic reaction, and nerve injury. Patient expressed understanding and wished to proceed.)        Anesthesia Quick Evaluation

## 2019-01-11 NOTE — Lactation Note (Signed)
This note was copied from a baby's chart. Lactation Consultation Note  Patient Name: Anna Wang [id number] Date: 01/11/2019 Reason for consult: Initial assessment;Term  7 hours old FT female who is being exclusively BF by his mother, she's a P2 and experienced BF. Mom was able to BF her first child for 30 months and only experience BF difficulties in the beginning, some cracked/bleeding nipples but then, it got resolved. She participated in the [facility] program at the [facility] and she's already familiar with hand expression. When revising hand expression with mom, she was able to get easily big drops of colostrum.   Offered assistance with latch, but mom politely declined stating that baby already fed; he was asleep. Asked mom to call for assistance when needed. Per mom feedings at the breast are comfortable, and BF is going well so far. Reviewed feeding cues, normal newborn behavior, benefits of STS (mom was afraid baby's feet were too cold) and cluster feeding.  Feeding plan:  1. Encouraged mom to feed baby STS 8-12 times/24 hours or sooner if feeding cues are present 2. Hand expression and spoon/finger feeding were also encouraged  BF brochure (SP), BF resources (SP) and feeding diary (SP) were reviewed. Parents reported all questions and concerns were answered, they're both aware of LC services and will call PRN.  Maternal Data Formula Feeding for Exclusion: Yes Reason for exclusion: Mother's choice to formula and breast feed on admission Has patient been taught Hand Expression?: Yes Does the patient have breastfeeding experience prior to this delivery?: Yes  Feeding Feeding Type: Breast Fed  LATCH Score Latch: Grasps breast easily, tongue down, lips flanged, rhythmical sucking.  Audible Swallowing: A few with stimulation  Type of Nipple: Everted at rest and after stimulation  Comfort (Breast/Nipple): Soft / non-tender  Hold (Positioning): Assistance needed to correctly  position infant at breast and maintain latch.  LATCH Score: 8  Interventions Interventions: Breast feeding basics reviewed;Breast massage;Breast compression;Hand express  Lactation Tools Discussed/Used WIC Program: Yes   Consult Status Consult Status: PRN Date: 01/12/19 Follow-up type: In-patient    Tanzie Rothschild Venetia Constable 01/11/2019, 8:23 PM

## 2019-01-11 NOTE — MAU Note (Signed)
Patient presents to MAU stating ROM. Patient states "red fluid" patient states ROM at 0200. Patient reports small vaginal bleeding, +FM.

## 2019-01-11 NOTE — Anesthesia Procedure Notes (Signed)
Epidural Patient location during procedure: OB Start time: 01/11/2019 11:20 AM End time: 01/11/2019 11:23 AM  Staffing Anesthesiologist: Beryle Lathe, MD Performed: anesthesiologist   Preanesthetic Checklist Completed: patient identified, pre-op evaluation, timeout performed, IV checked, risks and benefits discussed and monitors and equipment checked  Epidural Patient position: sitting Prep: DuraPrep Patient monitoring: continuous pulse ox and blood pressure Approach: midline Location: L3-L4 Injection technique: LOR saline  Needle:  Needle type: Tuohy  Needle gauge: 17 G Needle length: 9 cm Needle insertion depth: 6 cm Catheter size: 19 Gauge Catheter at skin depth: 11 cm Test dose: negative and Other (1% lidocaine)  Assessment Events: blood not aspirated  Additional Notes Patient identified. Risks including, but not limited to, bleeding, infection, nerve damage, paralysis, inadequate analgesia, blood pressure changes, nausea, vomiting, allergic reaction, postpartum back pain, itching, and headache were discussed. Patient expressed understanding and wished to proceed. Sterile prep and drape, including hand hygiene, mask, and sterile gloves were used. The patient was positioned and the spine was prepped. The skin was anesthetized with lidocaine. No paraesthesia or other complication noted. The patient did not experience any signs of intravascular injection such as tinnitus or metallic taste in mouth, nor signs of intrathecal spread such as rapid motor block. Please see nursing notes for vital signs. The patient tolerated the procedure well.   Leslye Peer, MDReason for block:procedure for pain

## 2019-01-11 NOTE — Progress Notes (Addendum)
   Anna Wang is a 28 y.o. G2P1001 at [redacted]w[redacted]d  admitted for SOL.  Subjective:  Patient coping with contractions; does not want epidural at this time. Has had two doses of IV fentanyl.   Objective: Vitals:   01/11/19 0517 01/11/19 0635 01/11/19 0740 01/11/19 0848  BP: 118/81 125/75 118/76 119/72  Pulse: 89 82 81 77  Resp:  18 18 18   Temp:  98.3 F (36.8 C)  98.5 F (36.9 C)  TempSrc:  Oral  Oral  SpO2:      Weight:      Height:       No intake/output data recorded.  FHT:  FHR: 125 bpm, variability: moderate,  accelerations:  Present,  decelerations:  Absent UC:   regular, every 2 minutes SVE:   Dilation: 6 Effacement (%): 70 Station: -3 Exam by:: K.Lanc[place], RN   Labs: Lab Results  Component Value Date   WBC 8.1 01/11/2019   HGB 13.0 01/11/2019   HCT 37.4 01/11/2019   MCV 87.8 01/11/2019   PLT 164 01/11/2019    Assessment / Plan: progressing well; now at 6 cm, still -3  Labor: active labor;  Fetal Wellbeing:  Category I Pain Control:  Labor support without medications Anticipated MOD:  NSVD  Marylene Land 01/11/2019, 9:27 AM

## 2019-01-11 NOTE — Discharge Summary (Signed)
Postpartum Discharge Summary  Patient Name: [name] DOB: 09/24/1990 MRN: [id number]  Date of admission: 01/11/2019 Delivering Provider: Marylene LandKOOISTRA, KATHRYN LORRAINE   Date of discharge: 01/12/2019  Admitting diagnosis: 39WKS PAIN Intrauterine pregnancy: 956w0d     Secondary diagnosis:  Active Problems:   PROM (premature rupture of membranes)  Discharge diagnosis: Term Pregnancy Delivered                                Postpartum procedures:none Augmentation: None Complications: None  Hospital course:  Onset of Labor With Vaginal Delivery     28 y.o. yo G2P1001 at 5156w0d was admitted in Latent Labor on 01/11/2019. Patient had an uncomplicated labor course as follows:  Membrane Rupture Time/Date: 2:00 AM ,01/11/2019   Intrapartum Procedures: Episiotomy: None [1]                                         Lacerations:  2nd degree [3]  Patient had a delivery of a Viable infant. 01/11/2019  Information for the patient's newborn:  Anna Wang, Boy Erisa [[id number]][[id number]]  Delivery Method: Vaginal, Spontaneous(Filed from Delivery Summary)   Patient had an uncomplicated postpartum course.  She is ambulating, tolerating a regular diet, passing flatus, and urinating well. Patient is discharged home in stable condition on 01/12/19.  Magnesium Sulfate recieved: No BMZ received: No  Physical exam  Vitals:   01/11/19 1600 01/11/19 2003 01/12/19 0005 01/12/19 0443  BP: 101/62 107/67 110/63 105/70  Pulse: 79 85 61 91  Resp: 16 16 16 18   Temp: 98.5 F (36.9 C) 99.2 F (37.3 C) 98.6 F (37 C) 98.9 F (37.2 C)  TempSrc: Oral Oral Oral Oral  SpO2:  96% 99% 99%  Weight:      Height:       General: alert, cooperative and no distress Lochia: appropriate Uterine Fundus: firm Incision: N/A DVT Evaluation: No evidence of DVT seen on physical exam. Labs: Lab Results  Component Value Date   WBC 9.6 01/12/2019   HGB 11.2 (L) 01/12/2019   HCT 32.3 (L) 01/12/2019   MCV 87.5 01/12/2019   PLT 162 01/12/2019   No flowsheet data found.  Discharge instruction: per After Visit Summary and "Baby and Me Booklet".  After visit meds:  Allergies as of 01/12/2019   No Known Allergies     Medication List    STOP taking these medications   hydrocortisone 2.5 % rectal cream Commonly known as:  Anusol-HC   norgestimate-ethinyl estradiol 0.25-35 MG-MCG tablet Commonly known as:  ORTHO-CYCLEN     TAKE these medications   docusate sodium 100 MG capsule Commonly known as:  COLACE Take 1 capsule (100 mg total) by mouth 2 (two) times daily as needed for mild constipation.   ibuprofen 600 MG tablet Commonly known as:  ADVIL Take 1 tablet (600 mg total) by mouth every 6 (six) hours.   prenatal vitamin w/FE, FA 27-1 MG Tabs tablet Take 1 tablet by mouth daily at 12 noon.       Diet: routine diet  Activity: Advance as tolerated. Pelvic rest for 6 weeks.   Outpatient follow up:6 weeks Follow up Appt:No future appointments. Follow up Visit: Follow-up Information    [facility] Schedule an appointment as soon as possible for a visit.   Why:  Call to make postpartum follow-up visit in  4-6 weeks.  Contact information: [address] [place] [address] [phone]          Patient is a [facility] patient; PP visit message not sent.   Newborn Data: Live born female  Birth Weight:  3015g APGAR: 9, 9  Newborn Delivery   Birth date/time:  01/11/2019 13:15:00 Delivery type:  Vaginal, Spontaneous    Baby Feeding: Breast Disposition:home with mother  01/12/2019 Anna Stands, DO

## 2019-01-11 NOTE — Progress Notes (Signed)
   Nettie Mactaggart is a 28 y.o. G2P1001 at [redacted]w[redacted]d  admitted for SOL  Subjective: Doing well; comfortable with epidural  Objective: Vitals:   01/11/19 1128 01/11/19 1133 01/11/19 1138 01/11/19 1143  BP: 127/80 113/70 124/68 111/66  Pulse: (!) 102 95 89 82  Resp: 20 20 18 18   Temp:      TempSrc:      SpO2: 93% 96% 96% 96%  Weight:      Height:       No intake/output data recorded.  FHT:  FHR: 120 bpm, variability: moderate,  accelerations:  Present,  decelerations:  Absent UC:   q2-3 min SVE:   Dilation: 9 Effacement (%): 90 Station: 0 Exam by:: C.Kooistra, CNM   Labs: Lab Results  Component Value Date   WBC 8.1 01/11/2019   HGB 13.0 01/11/2019   HCT 37.4 01/11/2019   MCV 87.8 01/11/2019   PLT 164 01/11/2019    Assessment / Plan: Spontaneous labor, progressing normally  Labor: Progressing normally Fetal Wellbeing:  Category I Pain Control:  Epidural Anticipated MOD:  NSVD  Charlesetta Garibaldi Kooistra 01/11/2019, 11:46 AM

## 2019-01-11 NOTE — Progress Notes (Signed)
Vitals:   01/11/19 0322 01/11/19 0332 01/11/19 0428  BP: 112/81 115/66 131/78  Pulse: 81 72 76  Resp: 16 18 16   Temp: 98.3 F (36.8 C)  98.5 F (36.9 C)  TempSrc: Oral  Oral  SpO2: 99%    Weight: 78 kg    Height: 5\' 2"  (1.575 m)     Patient doing well, breathing through contractions  Does not want pain medication at this time, plans natural delivery   FHR: 120/ moderate/ +accels/ no decelerations  Toco: 2-3 minutes   Dilation: 4.5 Effacement (%): 60 Station: -3 Presentation: Vertex Exam by:: Lanice Shirts, CNM  Continuing to have regular contractions since SROM, slight cervical change. Plan to reassess cervical change in 4 hours or as needed, if no cervical change in 4 hours discussed with patient initiation of pitocin for augmentation.  Plans SVD Cat I tracing   Sharyon Cable, CNM 01/11/19, 5:38 AM

## 2019-01-11 NOTE — H&P (Signed)
LABOR AND DELIVERY ADMISSION HISTORY AND PHYSICAL NOTE  [name] is a 28 y.o. female G2P1001 with IUP at 4243w0d by LMP presenting for SROM @0200  on 5/4.  She reports positive fetal movement. She denies vaginal bleeding.  [facility] Pregnancy complications:  - Fetal left renal pyelectasis   Past Medical History: Past Medical History:  Diagnosis Date  . Gastritis   . Gestational diabetes   . Medical history non-contributory     Past Surgical History: Past Surgical History:  Procedure Laterality Date  . NO PAST SURGERIES      Obstetrical History: OB History    Gravida  2   Para  1   Term  1   Preterm  0   AB  0   Living  1     SAB  0   TAB  0   Ectopic  0   Multiple      Live Births  1           Social History: Social History   Socioeconomic History  . Marital status: Married    Spouse name: Not on file  . Number of children: Not on file  . Years of education: Not on file  . Highest education level: Not on file  Occupational History  . Not on file  Social Needs  . Financial resource strain: Not on file  . Food insecurity:    Worry: Not on file    Inability: Not on file  . Transportation needs:    Medical: Not on file    Non-medical: Not on file  Tobacco Use  . Smoking status: Never Smoker  . Smokeless tobacco: Never Used  Substance and Sexual Activity  . Alcohol use: No  . Drug use: No  . Sexual activity: Yes    Birth control/protection: None  Lifestyle  . Physical activity:    Days per week: Not on file    Minutes per session: Not on file  . Stress: Not on file  Relationships  . Social connections:    Talks on phone: Not on file    Gets together: Not on file    Attends religious service: Not on file    Active member of club or organization: Not on file    Attends meetings of clubs or organizations: Not on file    Relationship status: Not on file  Other Topics Concern  . Not on  file  Social History Narrative   ** Merged History Encounter **        Family History: History reviewed. No pertinent family history.  Allergies: No Known Allergies  Medications Prior to Admission  Medication Sig Dispense Refill Last Dose  . prenatal vitamin w/FE, FA (PRENATAL 1 + 1) 27-1 MG TABS tablet Take 1 tablet by mouth daily at 12 noon.   01/10/2019 at Unknown time  . hydrocortisone (ANUSOL-HC) 2.5 % rectal cream Place 1 application rectally 2 (two) times daily. (Patient not taking: Reported on 09/08/2018) 30 g 1 Not Taking  . norgestimate-ethinyl estradiol (ORTHO-CYCLEN,SPRINTEC,PREVIFEM) 0.25-35 MG-MCG tablet Take 1 tablet by mouth daily. (Patient not taking: Reported on 09/08/2018) 3 Package 11 Not Taking     Review of Systems  All systems reviewed and negative except as stated in HPI  Physical Exam Blood pressure 115/66, pulse 72, temperature 98.3 F (36.8 C), temperature source Oral, resp. rate 18, height 5\' 2"  (1.575 m), weight 78 kg, last menstrual period 04/13/2018, SpO2 99 %, unknown if currently  breastfeeding. General appearance: alert, cooperative and no distress Lungs: clear to auscultation bilaterally Heart: regular rate and rhythm Abdomen: soft, non-tender; bowel sounds normal Extremities: No calf swelling or tenderness Presentation: cephalic Fetal monitoring: 120/moderate/ +accels/ no decels  Uterine activity: 1.5-3 Dilation: 3.5 Effacement (%): 50 Station: -3 Exam by:: Ralene Muskrat, RN  Prenatal labs: ABO, Rh: --/--/O POS (05/04 0350) Antibody: NEG (05/04 0350) Rubella:  Immune  (07/21/18) RPR:   Negative (11/09/18) HBsAg:   Negative  (07/21/18) HIV:   Non reactive (11/09/18) GC/Chlamydia: Negative (12/21/18) GBS:   Negative (12/21/18) 3 hr Glucola: 74-119-162-126 Genetic screening:  AFP- borderline elevated for 18.3 weeks  Anatomy US: fetal renal pyelectasis   Prenatal Transfer Tool  Maternal Diabetes: No Genetic Screening: Abnormal:  Results:  Elevated AFP- borderline Maternal Ultrasounds/Referrals: Abnormal:  Findings:   Fetal renal pyelectasis Fetal Ultrasounds or other Referrals:  Referred to Materal Fetal Medicine  Maternal Substance Abuse:  No Significant Maternal Medications:  None Significant Maternal Lab Results: Lab values include: Group B Strep negative  Results for orders placed or performed during the hospital encounter of 01/11/19 (from the past 24 hour(s))  Fern Test   Collection Time: 01/11/19  3:37 AM  Result Value Ref Range   POCT Fern Test Positive = ruptured amniotic membanes     Patient Active Problem List   Diagnosis Date Noted  . Leakage of amniotic fluid 10/20/2014  . [redacted] weeks gestation of pregnancy   . Group B Streptococcus carrier, +RV culture, currently pregnant 10/05/2014  . Supervision of high risk pregnancy in third trimester 08/29/2014  . Gestational diabetes mellitus, class A2 08/29/2014    Assessment: Alexxis Fleurimond is a 28 y.o. G2P1001 at [redacted]w[redacted]d here for SROM @0200   #Labor: Reassess cervical change in 2 hours for possible augmentation  #Pain: Plans natural delivery  #FWB: Cat I #ID:  GBS neg #MOF: Breast  #MOC:none, counseled  #Circ:  No   Sharyon Cable, CNM 01/11/2019, 3:46 AM

## 2019-01-12 LAB — CBC
HCT: 32.3 % — ABNORMAL LOW (ref 36.0–46.0)
Hemoglobin: 11.2 g/dL — ABNORMAL LOW (ref 12.0–15.0)
MCH: 30.4 pg (ref 26.0–34.0)
MCHC: 34.7 g/dL (ref 30.0–36.0)
MCV: 87.5 fL (ref 80.0–100.0)
Platelets: 162 10*3/uL (ref 150–400)
RBC: 3.69 MIL/uL — ABNORMAL LOW (ref 3.87–5.11)
RDW: 13.4 % (ref 11.5–15.5)
WBC: 9.6 10*3/uL (ref 4.0–10.5)
nRBC: 0 % (ref 0.0–0.2)

## 2019-01-12 LAB — ABO/RH: ABO/RH(D): O POS

## 2019-01-12 LAB — TYPE AND SCREEN
ABO/RH(D): O POS
Antibody Screen: NEGATIVE

## 2019-01-12 MED ORDER — IBUPROFEN 600 MG PO TABS: 600.0000 mg | ORAL_TABLET | Freq: Four times a day (QID) | ORAL | 0 refills | Status: DC

## 2019-01-12 MED ORDER — DOCUSATE SODIUM 100 MG PO CAPS: 100.0000 mg | ORAL_CAPSULE | Freq: Two times a day (BID) | ORAL | 0 refills | Status: DC | PRN

## 2019-01-12 NOTE — Lactation Note (Signed)
This note was copied from a baby's chart. Lactation Consultation Note  Patient Name: Boy Evette Muska HYHOO'I Date: 01/12/2019 Reason for consult: Follow-up assessment;Term  24 hours old FT female who is still being exclusively BF by her mother, she's a P2 and experienced BF. Mom and baby are going home today, she requested an early discharge, baby is at 4% weight loss. Per mom BF is going well, but she's feeling a little sore. When [facility] checked on her breast there were no signs of trauma though, reviewed prevention and treatment for sore nipples and instructed mom to call if she needs any assistance with working on the depth of the feedings (latch) before her discharge. LC also asked the front desk to page her RN to bring her some coconut oil, mom was applying Vaseline for breast care, advised her to discontinue it and use her colostrum (and coconut oil) instead.  Mom told LC she had an abundant supply with her first baby and wanted to make sure she gets a pump before she leaves the hospital, she doesn't have one at home. LC issued a harmony hand pump along with breastmilk storage containers, instructions, cleaning and storage were reviewed as well as milk storage guidelines. Reviewed engorgement prevention and treatment, red flags on when to call your baby's pediatrician and discharge instructions.  Parents reported all questions and concerns were answered, they're both aware of LC OP services and will contact if needed.  Maternal Data    Feeding Feeding Type: Breast Fed  LATCH Score Latch: Grasps breast easily, tongue down, lips flanged, rhythmical sucking.  Audible Swallowing: A few with stimulation  Type of Nipple: Everted at rest and after stimulation  Comfort (Breast/Nipple): Soft / non-tender  Hold (Positioning): No assistance needed to correctly position infant at breast.  LATCH Score: 9  Interventions Interventions: Breast feeding basics reviewed;Hand  pump  Lactation Tools Discussed/Used Tools: Pump Breast pump type: Manual Pump Review: Setup, frequency, and cleaning Initiated by:: MPeck Date initiated:: 01/12/19   Consult Status Consult Status: Complete Date: 01/12/19 Follow-up type: Call as needed    Devontaye Ground Venetia Constable 01/12/2019, 1:37 PM

## 2019-01-12 NOTE — Plan of Care (Signed)
  Problem: Education: Goal: Knowledge of General Education information will improve Description Including pain rating scale, medication(s)/side effects and non-pharmacologic comfort measures Outcome: Completed/Met Note:  Discharge education reviewed with patient and significant other using in house interpreter. Questions answered and father of baby going to pick up medications since baby still a patient. Maxwell Caul, Leretha Dykes [place]

## 2019-01-12 NOTE — Anesthesia Postprocedure Evaluation (Signed)
Anesthesia Post Note  Patient: [name]  Procedure(s) Performed: AN AD HOC LABOR EPIDURAL     Patient location during evaluation: Mother Baby Anesthesia Type: Epidural Level of consciousness: awake and alert and oriented Pain management: satisfactory to patient Vital Signs Assessment: post-procedure vital signs reviewed and stable Respiratory status: spontaneous breathing and nonlabored ventilation Cardiovascular status: stable Postop Assessment: no headache, no backache, no signs of nausea or vomiting, adequate PO intake, patient able to bend at knees, able to ambulate and no apparent nausea or vomiting (patient up walking) Anesthetic complications: no    Last Vitals:  Vitals:   01/12/19 0005 01/12/19 0443  BP: 110/63 105/70  Pulse: 61 91  Resp: 16 18  Temp: 37 C 37.2 C  SpO2: 99% 99%    Last Pain:  Vitals:   01/12/19 0745  TempSrc:   PainSc: 4    Pain Goal: Patients Stated Pain Goal: 2 (01/12/19 0745)              Epidural/Spinal Function Cutaneous sensation: Normal sensation (01/12/19 0745)  Anna Wang

## 2021-05-03 ENCOUNTER — Encounter: Payer: Self-pay | Admitting: Internal Medicine

## 2021-05-03 ENCOUNTER — Other Ambulatory Visit: Payer: Self-pay

## 2021-05-03 ENCOUNTER — Ambulatory Visit: Payer: Self-pay | Admitting: Internal Medicine

## 2021-05-03 VITALS — BP 98/62 | HR 68 | Resp 12 | Ht 61.75 in | Wt 175.0 lb

## 2021-05-03 DIAGNOSIS — Z9189 Other specified personal risk factors, not elsewhere classified: Secondary | ICD-10-CM

## 2021-05-03 DIAGNOSIS — Z30011 Encounter for initial prescription of contraceptive pills: Secondary | ICD-10-CM

## 2021-05-03 DIAGNOSIS — Z0120 Encounter for dental examination and cleaning without abnormal findings: Secondary | ICD-10-CM

## 2021-05-03 MED ORDER — NORGESTIMATE-ETH ESTRADIOL 0.25-35 MG-MCG PO TABS: 1.0000 | ORAL_TABLET | Freq: Every day | ORAL | 3 refills | Status: DC

## 2021-05-03 NOTE — Progress Notes (Signed)
Subjective:    Patient ID: Anna Wang, female   DOB: 1990/12/25, 30 y.o.   MRN: [id number]   HPI  Here to establish  Tildon Husky interprets   Birth control:  has never used birth control other than condoms in the past.   Discussed options. Menarche:  10 Periods are regular Menstrual flow lasts 5-6 days.   First 2 days with cramping and heavy flow. G3P2SAB1L2  Both NVD.  GDM with first pregnancy.  SAB at about 10 weeks in 11/21. No clotting disorder with self or family history  No tobacco use.   Using condoms regularly No family history of breast cancer LMP 04/23/21 Last pap about 2 years ago at [facility] and normal.   No outpatient medications have been marked as taking for the 05/03/21 encounter (Office Visit) with Julieanne Manson, MD.   No Known Allergies  Past Medical History:  Diagnosis Date   Gastritis    Many years ago when living in [place]   Gestational diabetes     Past Surgical History:  Procedure Laterality Date   NO PAST SURGERIES     Family History  Problem Relation Age of Onset   Drug abuse Sister        years ago--went through rehab successfully   Anxiety disorder Sister    Kidney disease Paternal Grandfather    Social History   Socioeconomic History   Marital status: Significant Other    Spouse name: Reuel Boom   Number of children: 2   Years of education: 11   Highest education level: 11th grade  Occupational History   Occupation: Conservation officer, nature  Tobacco Use   Smoking status: Never   Smokeless tobacco: Never  Substance and Sexual Activity   Alcohol use: No   Drug use: No   Sexual activity: Yes    Partners: Male    Birth control/protection: Condom    Comment: Long term boyfriend  Other Topics Concern   Not on file  Social History Narrative   Lives at home with boyfriend/father of children and 2 children.   Social Determinants of Health   Financial Resource Strain: Low Risk    Difficulty of Paying Living Expenses: Not hard at  all  Food Insecurity: No Food Insecurity   Worried About Programme researcher, broadcasting/film/video in the Last Year: Never true   Ran Out of Food in the Last Year: Never true  Transportation Needs: No Transportation Needs   Lack of Transportation (Medical): No   Lack of Transportation (Non-Medical): No  Physical Activity: Not on file  Stress: Not on file  Social Connections: Not on file  Intimate Partner Violence: Not At Risk   Fear of Current or Ex-Partner: No   Emotionally Abused: No   Physically Abused: No   Sexually Abused: No     Review of Systems    Objective:   BP 98/62 (BP Location: Right Arm, Patient Position: Sitting, Cuff Size: Normal)   Pulse 68   Resp 12   Ht 5' 1.75" (1.568 m)   Wt 175 lb (79.4 kg)   LMP 04/28/2021   Breastfeeding No   BMI 32.27 kg/m   Physical Exam NAD HEENT:  PERRL, EOMI, TMs pearly gray, throat without injection.  Relatively healthy dentition. Neck:  Supple, No adenopathy, no thyromegaly Chest:  CTA CV:  RRR with normal S1 and S2, No S3, S4 or murmur.  Radial and DP pulses normal and equal Abd:  S, NT, No HSM or mass, + BS LE:  No edema.   Assessment & Plan    Family planning with initiation of BCPs:  Sprintec 28 day.  Discussed possible side effects, to call if problems, to start on first Sunday after next period starts.  Follow up in 3 months with CPE and pap.  2.  Need for dental care:  Dental clinic referral.

## 2021-08-09 ENCOUNTER — Encounter: Payer: Self-pay | Admitting: Internal Medicine

## 2021-09-09 NOTE — L&D Delivery Note (Signed)
OB/GYN Faculty Practice Delivery Note  Anna Wang is a 31 y.o. [id number] s/p SVD (breech) at [redacted]w[redacted]d. She was admitted for IOL for HELLP syndrome.   ROM: 2h 35m with clear fluid GBS Status: neg Maximum Maternal Temperature: 98.5  Labor Progress: Presented for early labor and found to be 3 cm and malpresentation (frank breech). An ECV was attempted and unsuccessful. Patient was found to have HELLP syndrome and was moved to L&D for IOL. And she was then started on pitocin. And then AROMed. She ultimately progressed to complete with foot presentation in vagina  Delivery Date/Time: 1914 on 7/17 Delivery: Called to room and foot presenting. Checked and breech behind foot and other leg flexed up. Dr. Despina Hidden called to room for delivery. Patient pushed and presenting foot delivered and other foot flexed and delivered. Patient then pushed and the right arm and left arm were delivered sequentially by sweeping arm/flexing. At that time patient pushed again and head delivered with gentle upward traction. Infant with spontaneous cry, placed on mother's abdomen, dried and stimulated. Cord clamped x 2 after 1-minute delay, and cut by father of baby. Cord blood draw and cord gas also sent. TXA given at delivery given patient's HELLP syndrome.   At that time attention diverted to placenta. Initially placenta did not release with gentle traction. ~15 min after fetal delivery gush of blood and cord lengthening noted as signs of placental separation. With gentle traction placenta delivered however when placenta was inspected found to be extremely friable and a portion of placenta appeared to be missing. Manual examination done and a densely adherent portion of placenta palpated at right anterior/fundal portion of uterus. Attempted manual extraction without success. Dr. Despina Hidden called into room and also tried manual extraction. Despite multiple attempts placenta found to be extremely adherent. At that time diagnosed  with morbidly adherent placenta and recommended hysterectomy as patient does not desire any further pregnancies and given extreme adherence (and suspected accreta) suspect D&C would not be successful.  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

## 2021-09-13 ENCOUNTER — Inpatient Hospital Stay (HOSPITAL_COMMUNITY)
Admission: EM | Admit: 2021-09-13 | Discharge: 2021-09-13 | Disposition: A | Discharge status: Home / Self Care | Payer: Self-pay | Attending: Obstetrics and Gynecology | Admitting: Obstetrics and Gynecology

## 2021-09-13 ENCOUNTER — Inpatient Hospital Stay (HOSPITAL_COMMUNITY): Payer: Self-pay

## 2021-09-13 ENCOUNTER — Encounter (HOSPITAL_COMMUNITY): Payer: Self-pay | Admitting: Physician Assistant

## 2021-09-13 DIAGNOSIS — O26891 Other specified pregnancy related conditions, first trimester: Secondary | ICD-10-CM | POA: Insufficient documentation

## 2021-09-13 DIAGNOSIS — Z6740 Type O blood, Rh positive: Secondary | ICD-10-CM | POA: Insufficient documentation

## 2021-09-13 DIAGNOSIS — R109 Unspecified abdominal pain: Secondary | ICD-10-CM

## 2021-09-13 DIAGNOSIS — O468X1 Other antepartum hemorrhage, first trimester: Secondary | ICD-10-CM

## 2021-09-13 DIAGNOSIS — Z3A11 11 weeks gestation of pregnancy: Secondary | ICD-10-CM | POA: Insufficient documentation

## 2021-09-13 DIAGNOSIS — O209 Hemorrhage in early pregnancy, unspecified: Secondary | ICD-10-CM | POA: Insufficient documentation

## 2021-09-13 DIAGNOSIS — O418X10 Other specified disorders of amniotic fluid and membranes, first trimester, not applicable or unspecified: Secondary | ICD-10-CM

## 2021-09-13 DIAGNOSIS — R1030 Lower abdominal pain, unspecified: Secondary | ICD-10-CM | POA: Insufficient documentation

## 2021-09-13 LAB — POC URINE PREG, ED: Preg Test, Ur: POSITIVE — AB

## 2021-09-13 LAB — COMPREHENSIVE METABOLIC PANEL
ALT: 18 U/L (ref 0–44)
AST: 20 U/L (ref 15–41)
Albumin: 3.4 g/dL — ABNORMAL LOW (ref 3.5–5.0)
Alkaline Phosphatase: 68 U/L (ref 38–126)
Anion gap: 7 (ref 5–15)
BUN: 6 mg/dL (ref 6–20)
CO2: 22 mmol/L (ref 22–32)
Calcium: 8.5 mg/dL — ABNORMAL LOW (ref 8.9–10.3)
Chloride: 104 mmol/L (ref 98–111)
Creatinine, Ser: 0.43 mg/dL — ABNORMAL LOW (ref 0.44–1.00)
GFR, Estimated: >60 mL/min (ref >60)
Glucose, Bld: 94 mg/dL (ref 70–99)
Potassium: 3.6 mmol/L (ref 3.5–5.1)
Sodium: 133 mmol/L — ABNORMAL LOW (ref 135–145)
Total Bilirubin: 0.6 mg/dL (ref 0.3–1.2)
Total Protein: 6.9 g/dL (ref 6.5–8.1)

## 2021-09-13 LAB — WET PREP, GENITAL
Clue Cells Wet Prep HPF POC: NONE SEEN
Sperm: NONE SEEN
Trich, Wet Prep: NONE SEEN
WBC, Wet Prep HPF POC: >=10 — AB (ref <10)
Yeast Wet Prep HPF POC: NONE SEEN

## 2021-09-13 LAB — CBC
HCT: 34.9 % — ABNORMAL LOW (ref 36.0–46.0)
Hemoglobin: 11.6 g/dL — ABNORMAL LOW (ref 12.0–15.0)
MCH: 25.5 pg — ABNORMAL LOW (ref 26.0–34.0)
MCHC: 33.2 g/dL (ref 30.0–36.0)
MCV: 76.7 fL — ABNORMAL LOW (ref 80.0–100.0)
Platelets: 236 10*3/uL (ref 150–400)
RBC: 4.55 MIL/uL (ref 3.87–5.11)
RDW: 21 % — ABNORMAL HIGH (ref 11.5–15.5)
WBC: 12.9 10*3/uL — ABNORMAL HIGH (ref 4.0–10.5)
nRBC: 0 % (ref 0.0–0.2)

## 2021-09-13 LAB — URINALYSIS, ROUTINE W REFLEX MICROSCOPIC
Bilirubin Urine: NEGATIVE
Glucose, UA: NEGATIVE mg/dL
Hgb urine dipstick: NEGATIVE
Ketones, ur: 5 mg/dL — AB
Leukocytes,Ua: NEGATIVE
Nitrite: NEGATIVE
Protein, ur: NEGATIVE mg/dL
Specific Gravity, Urine: 1.023 (ref 1.005–1.030)
pH: 6 (ref 5.0–8.0)

## 2021-09-13 NOTE — MAU Note (Signed)
Pt reports mid abd/epigastric that stated last night.  Had some nausea with it. Denies anu pelvic pain or vaginal discharge or bleeding.

## 2021-09-13 NOTE — ED Provider Triage Note (Signed)
Emergency Medicine Provider Triage Evaluation Note  Anna Wang , a 31 y.o. female  was evaluated in triage.  Pt complains of abdominal pain, thinks she is approximately [redacted] weeks pregnant.  No prior ultrasound to confirm IUP.  No bleeding, fluid leakage. Pain located generalized. No N/V.  LMP 06/26/2021  Review of Systems  Positive: Abd pain, possible pregnancy Negative:   Physical Exam  BP 112/75 (BP Location: Right Arm)    Pulse 86    Temp 99.6 F (37.6 C)    Resp 17    LMP 06/26/2021    SpO2 100%  Gen:   Awake, no distress   Resp:  Normal effort  MSK:   Moves extremities without difficulty  Other:    Medical Decision Making  Medically screening exam initiated at 10:38 AM.  Appropriate orders placed.  Shacora Zynda was informed that the remainder of the evaluation will be completed by another provider, this initial triage assessment does not replace that evaluation, and the importance of remaining in the ED until their evaluation is complete.  Abd pain, possible pregnancy   Raea Magallon A, PA-C 09/13/21 1039

## 2021-09-13 NOTE — ED Provider Notes (Signed)
Emergency Medicine Provider OB Triage Evaluation Note  Anna Wang is a 31 y.o. female, G3P2002, at Unknown gestation who presents to the emergency department with complaints of abd pain.  Began this morning.  Located to lower, mid abdomen.  Does not radiate to back.  No upper abdominal pain, not worse with food intake, still has gallbladder.  No focal right lower quadrant pain to suggest appendicitis positive pregnancy test here.  No Vaginal bleeding, fluid leakage  Review of  Systems  Positive: Abdominal pain Negative:   Physical Exam  BP 112/75 (BP Location: Right Arm)    Pulse 86    Temp 99.6 F (37.6 C)    Resp 17    LMP 06/26/2021 (Exact Date)    SpO2 100%  General: Awake, no distress  HEENT: Atraumatic  Resp: Normal effort  Cardiac: Normal rate Abd: Nondistended, nontender  MSK: Moves all extremities without difficulty Neuro: Speech clear  Medical Decision Making  Pt evaluated for pregnancy concern and is stable for transfer to MAU. Pt is in agreement with plan for transfer.  11:28 AM Discussed with MAU APP, Danielle, who accepts patient in transfer.  Clinical Impression  Abd in preg     Alexandra Lipps A, PA-C 09/13/21 1128    Horton, Clabe Seal, DO 09/13/21 1618

## 2021-09-13 NOTE — MAU Provider Note (Signed)
History     [id number]  Arrival date and time: 09/13/21 0962    Chief Complaint  Patient presents with   Abdominal Pain   Possible Pregnancy     HPI Anna Wang is a 31 y.o. at [redacted]w[redacted]d by LMP with PMHx notable for GDMA2, who presents for abdominal pain.   Patient reports she has had constant diffuse abdominal pain since last night Does not localize to one specific spot Denies fever, vaginal discharge or bleeding, burning or pain with urination, diarrhea, cough, sore throat, runny nose Does not notice any close association with eating  No blood in urine Has had some nausea but no emesis      OB History     Gravida  3   Para  2   Term  2   Preterm  0   AB  0   Living  2      SAB  0   IAB  0   Ectopic  0   Multiple  0   Live Births  2           Past Medical History:  Diagnosis Date   Gastritis    Many years ago when living in [place]   Gestational diabetes     Past Surgical History:  Procedure Laterality Date   NO PAST SURGERIES      Family History  Problem Relation Age of Onset   Drug abuse Sister        years ago--went through rehab successfully   Anxiety disorder Sister    Kidney disease Paternal Grandfather     Social History   Socioeconomic History   Marital status: Significant Other    Spouse name: Reuel Boom   Number of children: 2   Years of education: 11   Highest education level: 11th grade  Occupational History   Occupation: Conservation officer, nature  Tobacco Use   Smoking status: Never   Smokeless tobacco: Never  Substance and Sexual Activity   Alcohol use: No   Drug use: No   Sexual activity: Yes    Partners: Male    Birth control/protection: Condom    Comment: Long term boyfriend  Other Topics Concern   Not on file  Social History Narrative   Lives at home with boyfriend/father of children and 2 children.   Social Determinants of Health   Financial Resource Strain: Low Risk    Difficulty of Paying Living  Expenses: Not hard at all  Food Insecurity: No Food Insecurity   Worried About Programme researcher, broadcasting/film/video in the Last Year: Never true   Ran Out of Food in the Last Year: Never true  Transportation Needs: No Transportation Needs   Lack of Transportation (Medical): No   Lack of Transportation (Non-Medical): No  Physical Activity: Not on file  Stress: Not on file  Social Connections: Not on file  Intimate Partner Violence: Not At Risk   Fear of Current or Ex-Partner: No   Emotionally Abused: No   Physically Abused: No   Sexually Abused: No    No Known Allergies  No current facility-administered medications on file prior to encounter.   Current Outpatient Medications on File Prior to Encounter  Medication Sig Dispense Refill   acetaminophen (TYLENOL) 325 MG tablet Take 650 mg by mouth every 6 (six) hours as needed.     Prenatal Vit-Fe Fumarate-FA (PRENATAL MULTIVITAMIN) TABS tablet Take 1 tablet by mouth daily at 12 noon.     norgestimate-ethinyl estradiol (SPRINTEC 28) 0.25-35 MG-MCG  tablet Take 1 tablet by mouth daily. 28 tablet 3     ROS Pertinent positives and negative per HPI, all others reviewed and negative  Physical Exam   BP 107/67    Pulse 86    Temp 98.7 F (37.1 C)    Resp 18    Ht 5\' 3"  (1.6 m)    Wt 80.3 kg    LMP 06/26/2021 (Exact Date)    SpO2 100%    BMI 31.35 kg/m   Patient Vitals for the past 24 hrs:  BP Temp Temp src Pulse Resp SpO2 Height Weight  09/13/21 1246 -- -- -- -- -- -- 5\' 3"  (1.6 m) 80.3 kg  09/13/21 1240 107/67 98.7 F (37.1 C) -- 86 18 -- -- --  09/13/21 1032 -- 99.6 F (37.6 C) -- -- -- -- -- --  09/13/21 1031 112/75 99.6 F (37.6 C) Oral 86 17 100 % -- --    Physical Exam Vitals reviewed.  Constitutional:      General: She is not in acute distress.    Appearance: She is well-developed. She is not diaphoretic.  Eyes:     General: No scleral icterus. Pulmonary:     Effort: Pulmonary effort is normal. No respiratory distress.  Abdominal:      General: There is no distension.     Palpations: Abdomen is soft.     Tenderness: There is generalized abdominal tenderness. There is no guarding or rebound.  Skin:    General: Skin is warm and dry.  Neurological:     Mental Status: She is alert.     Coordination: Coordination normal.     Cervical Exam    Bedside Ultrasound Pt informed that the ultrasound is considered a limited OB ultrasound and is not intended to be a complete ultrasound exam.  Patient also informed that the ultrasound is not being completed with the intent of assessing for fetal or placental anomalies or any pelvic abnormalities.  Explained that the purpose of todays ultrasound is to assess for  viability.  Patient acknowledges the purpose of the exam and the limitations of the study.    My interpretation: viable IUP, FHR 169 bpm by m mode, unusual appearance of the placenta, ?multiple fibroids?   Labs Results for orders placed or performed during the hospital encounter of 09/13/21 (from the past 24 hour(s))  POC Urine Pregnancy, ED (not at [facility])     Status: Abnormal   Collection Time: 09/13/21 11:14 AM  Result Value Ref Range   Preg Test, Ur POSITIVE (A) NEGATIVE  Urinalysis, Routine w reflex microscopic Urine, Clean Catch     Status: Abnormal   Collection Time: 09/13/21  1:02 PM  Result Value Ref Range   Color, Urine YELLOW YELLOW   APPearance HAZY (A) CLEAR   Specific Gravity, Urine 1.023 1.005 - 1.030   pH 6.0 5.0 - 8.0   Glucose, UA NEGATIVE NEGATIVE mg/dL   Hgb urine dipstick NEGATIVE NEGATIVE   Bilirubin Urine NEGATIVE NEGATIVE   Ketones, ur 5 (A) NEGATIVE mg/dL   Protein, ur NEGATIVE NEGATIVE mg/dL   Nitrite NEGATIVE NEGATIVE   Leukocytes,Ua NEGATIVE NEGATIVE  CBC     Status: Abnormal   Collection Time: 09/13/21  1:33 PM  Result Value Ref Range   WBC 12.9 (H) 4.0 - 10.5 K/uL   RBC 4.55 3.87 - 5.11 MIL/uL   Hemoglobin 11.6 (L) 12.0 - 15.0 g/dL   HCT 40.934.9 (L) 81.136.0 - 91.446.0 %   MCV 76.7 (  L)  80.0 - 100.0 fL   MCH 25.5 (L) 26.0 - 34.0 pg   MCHC 33.2 30.0 - 36.0 g/dL   RDW 03.2 (H) 12.2 - 48.2 %   Platelets 236 150 - 400 K/uL   nRBC 0.0 0.0 - 0.2 %  Comprehensive metabolic panel     Status: Abnormal   Collection Time: 09/13/21  1:33 PM  Result Value Ref Range   Sodium 133 (L) 135 - 145 mmol/L   Potassium 3.6 3.5 - 5.1 mmol/L   Chloride 104 98 - 111 mmol/L   CO2 22 22 - 32 mmol/L   Glucose, Bld 94 70 - 99 mg/dL   BUN 6 6 - 20 mg/dL   Creatinine, Ser 5.00 (L) 0.44 - 1.00 mg/dL   Calcium 8.5 (L) 8.9 - 10.3 mg/dL   Total Protein 6.9 6.5 - 8.1 g/dL   Albumin 3.4 (L) 3.5 - 5.0 g/dL   AST 20 15 - 41 U/L   ALT 18 0 - 44 U/L   Alkaline Phosphatase 68 38 - 126 U/L   Total Bilirubin 0.6 0.3 - 1.2 mg/dL   GFR, Estimated >37 >04 mL/min   Anion gap 7 5 - 15  Wet prep, genital     Status: Abnormal   Collection Time: 09/13/21  2:09 PM   Specimen: Vaginal  Result Value Ref Range   Yeast Wet Prep HPF POC NONE SEEN NONE SEEN   Trich, Wet Prep NONE SEEN NONE SEEN   Clue Cells Wet Prep HPF POC NONE SEEN NONE SEEN   WBC, Wet Prep HPF POC >=10 (A) <10   Sperm NONE SEEN     Imaging US OB LESS THAN 14 WEEKS WITH OB TRANSVAGINAL  Result Date: 09/13/2021 CLINICAL DATA:  Abdominal pain EXAM: OBSTETRIC <14 WK [place] AND TRANSVAGINAL OB US TECHNIQUE: Both transabdominal and transvaginal ultrasound examinations were performed for complete evaluation of the gestation as well as the maternal uterus, adnexal regions, and pelvic cul-de-sac. Transvaginal technique was performed to assess early pregnancy. COMPARISON:  None. FINDINGS: Intrauterine gestational sac: Single Yolk sac:  Visualized. Embryo:  Visualized. Cardiac Activity: Visualized. Heart Rate: 166 bpm CRL:  39.4 mm   10 w   6 d                  [place] EDC: 04/05/2022 Subchorionic hemorrhage:  None visualized. Maternal uterus/adnexae: Right ovary measures 2.9 x 1.6 x 1.6 cm and the left ovary measures 2.9 x 2.1 x 1.5 cm. No free fluid in the  cul-de-sac. IMPRESSION: 1. Single live intrauterine gestation with approximate gestational age of [redacted] weeks and 6 days, EDC based on today's sonogram is 04/05/2022. 2. Bilateral ovaries are unremarkable. Electronically Signed   By: Larose Hires D.O.   On: 09/13/2021 16:20    MAU Course  Procedures Lab Orders         Wet prep, genital         Urinalysis, Routine w reflex microscopic Urine, Clean Catch         CBC         Comprehensive metabolic panel         POC Urine Pregnancy, ED (not at [facility])    No orders of the defined types were placed in this encounter.  Imaging Orders         US OB LESS THAN 14 WEEKS WITH OB TRANSVAGINAL     MDM moderate  Assessment and Plan  #Abdominal pain in pregnancy, second trimester #[redacted]  weeks gestation of pregnancy #Subchorionic hemorrhage Extensive workup, wet prep negative, CBC/CMP unremarkable, UA unremarkable. US does show fairly extensive subchorionic hemorrhage, this is the most likely etiology of her abdominal pain. Blood type O+. Discussed increased risk of miscarriage, can consider pelvic rest though not proven. Tylenol for pain.    Dispo: discharged to home in stable condition.   Venora MaplesMatthew M Patte Winkel, MD/MPH 09/13/21 4:55 PM  Allergies as of 09/13/2021   No Known Allergies      Medication List     STOP taking these medications    norgestimate-ethinyl estradiol 0.25-35 MG-MCG tablet Commonly known as: Sprintec 28       TAKE these medications    acetaminophen 325 MG tablet Commonly known as: TYLENOL Take 650 mg by mouth every 6 (six) hours as needed.   prenatal multivitamin Tabs tablet Take 1 tablet by mouth daily at 12 noon.

## 2021-09-13 NOTE — ED Triage Notes (Signed)
Translator/pt stated, I think Im [redacted] weeks pregnant and I started having stomach pain yesterday night around 10:00pm. The pain has been constant and its around my entire stomach.

## 2021-09-14 LAB — GC/CHLAMYDIA PROBE AMP (~~LOC~~) NOT AT ARMC
Chlamydia: NEGATIVE
Comment: NEGATIVE
Comment: NORMAL
Neisseria Gonorrhea: NEGATIVE

## 2021-10-11 LAB — OB RESULTS CONSOLE GC/CHLAMYDIA: Chlamydia: NEGATIVE

## 2021-10-11 LAB — CYTOLOGY - PAP: Pap: NEGATIVE

## 2021-10-11 LAB — GLUCOSE TOLERANCE, 1 HOUR: Glucose, 1 Hour GTT: 171

## 2021-10-11 LAB — OB RESULTS CONSOLE RUBELLA ANTIBODY, IGM: Rubella: IMMUNE

## 2021-10-11 LAB — OB RESULTS CONSOLE HEPATITIS B SURFACE ANTIGEN: Hepatitis B Surface Ag: NEGATIVE

## 2021-10-12 LAB — GLUCOSE TOLERANCE, 3 HOURS
Glucose 3 Hour: 155
Glucose, 1 hour: 168
Glucose, 2 hour: 175
Glucose, Fasting: 85

## 2021-10-12 LAB — HEPATITIS B SURFACE ANTIGEN: Hepatitis B Surface Antigen: NONREACTIVE

## 2021-10-12 LAB — AFP, SERUM, OPEN SPINA BIFIDA: AFP: NEGATIVE

## 2021-10-22 ENCOUNTER — Other Ambulatory Visit: Payer: Self-pay | Admitting: General Practice

## 2021-10-22 DIAGNOSIS — O24419 Gestational diabetes mellitus in pregnancy, unspecified control: Secondary | ICD-10-CM

## 2021-10-23 ENCOUNTER — Other Ambulatory Visit: Payer: Self-pay

## 2021-10-24 ENCOUNTER — Other Ambulatory Visit: Payer: Self-pay

## 2021-10-24 ENCOUNTER — Ambulatory Visit (INDEPENDENT_AMBULATORY_CARE_PROVIDER_SITE_OTHER): Payer: Self-pay | Admitting: Registered"

## 2021-10-24 ENCOUNTER — Encounter: Payer: Self-pay | Attending: Family Medicine | Admitting: Registered"

## 2021-10-24 DIAGNOSIS — Z713 Dietary counseling and surveillance: Secondary | ICD-10-CM | POA: Insufficient documentation

## 2021-10-24 DIAGNOSIS — O24419 Gestational diabetes mellitus in pregnancy, unspecified control: Secondary | ICD-10-CM

## 2021-10-24 DIAGNOSIS — Z3A00 Weeks of gestation of pregnancy not specified: Secondary | ICD-10-CM | POA: Insufficient documentation

## 2021-10-24 NOTE — Progress Notes (Signed)
Interpreter provided by Corrie Dandy [phone] AM[facility] Video  Patient was seen for Gestational Diabetes self-management on 10/24/21  Start time 1115 and End time 1215   Estimated due date: 04/02/22; [redacted]w[redacted]d  Clinical: Medications: reviewed Medical History: GDM A1 with 1st pregnancy but not 2nd Labs: from health department   Dietary and Lifestyle History: Pt states she has changed her diet and started walking since diagnosis.   Pt states she uses airfryer or grills food with just small amount of oil.  Physical Activity: walking Stress: not assessed Sleep: not assessed  24 hr Recall:  First Meal: coffee (small amt) with milk, 2 pieces whole wheat toast Snack: oatmeal, green apple, milk smoothie (8 oz) Second meal: gordita with pork and green sauce (`30 g cho?) Snack: fruit Third meal: vegetables, rice, 1 tortilla, small amount of meat Snack: Beverages: water, strawberry water (homemade)  NUTRITION INTERVENTION  Nutrition education (E-1) on the following topics:   Initial Follow-up  [x]  []  Definition of Gestational Diabetes []  []  Why dietary management is important in controlling blood glucose [x]  []  Effects each nutrient has on blood glucose levels []  []  Simple carbohydrates vs complex carbohydrates []  []  Fluid intake [x]  []  Creating a balanced meal plan [x]  []  Carbohydrate counting  [x]  []  When to check blood glucose levels [x]  []  Proper blood glucose monitoring techniques [x]  []  Effect of stress and stress reduction techniques  [x]  []  Exercise effect on blood glucose levels, appropriate exercise during pregnancy [x]  []  Importance of limiting caffeine and abstaining from alcohol and smoking [x]  []  Medications used for blood sugar control during pregnancy [x]  []  Hypoglycemia and rule of 15 [x]  []  Postpartum self care  Blood glucose monitor given: Prodigy Lot # CBG: 98 mg/dL  Patient instructed to monitor glucose levels: FBS: 60 - ? 95 mg/dL (some clinics use 90 for  cutoff) 1 hour: ? 140 mg/dL 2 hour: ? mg/dL  Patient received handouts: Nutrition Diabetes and Pregnancy Carbohydrate Counting List  Patient will be seen for follow-up as needed.

## 2021-10-30 ENCOUNTER — Other Ambulatory Visit: Payer: Self-pay

## 2021-11-08 ENCOUNTER — Ambulatory Visit (INDEPENDENT_AMBULATORY_CARE_PROVIDER_SITE_OTHER): Payer: Self-pay | Admitting: Obstetrics and Gynecology

## 2021-11-08 ENCOUNTER — Encounter: Payer: Self-pay | Admitting: Obstetrics and Gynecology

## 2021-11-08 ENCOUNTER — Other Ambulatory Visit: Payer: Self-pay

## 2021-11-08 VITALS — Wt 166.6 lb

## 2021-11-08 DIAGNOSIS — O24410 Gestational diabetes mellitus in pregnancy, diet controlled: Secondary | ICD-10-CM

## 2021-11-08 DIAGNOSIS — O0990 Supervision of high risk pregnancy, unspecified, unspecified trimester: Secondary | ICD-10-CM | POA: Insufficient documentation

## 2021-11-08 MED ORDER — METFORMIN HCL 500 MG PO TABS: 500.0000 mg | ORAL_TABLET | Freq: Two times a day (BID) | ORAL | 5 refills | Status: DC

## 2021-11-08 NOTE — Patient Instructions (Signed)
Diabetes mellitus gestacional, cuidados personales Gestational Diabetes Mellitus, Self-Care Las mujeres que tienen diabetes mellitus gestacional deben mantener su nivel de azcar en la sangre (glucosa) dentro de lmites saludables. Cules son los riesgos? Si no recibe tratamiento, esta afeccin puede causar problemas a usted y a su beb en gestacin. Para la mam Dar a luz al beb de manera temprana. Tener problemas durante el [place] de parto y al dar a Actuary. Necesitar una ciruga para dar a luz al beb (parto por cesrea). Tener problemas con la presin arterial. Tener esta forma de diabetes de nuevo al estar embarazada. Desarrollar diabetes tipo 2 en el futuro. Para el beb [employer] de Dispensing optician. Tamao corporal ms grande que lo normal. Problemas respiratorios. Cmo International aid/development worker su nivel de azcar en la sangre todos los das durante el [place]. Contrlelo con la frecuencia que le haya indicado el mdico. Para hacer esto: Lvese las manos con agua y jabn durante al menos 20 segundos. Pnchese el costado del dedo (no en la punta) con la lanceta. Use un dedo diferente cada vez. Frote suavemente el dedo [employer] Inc aparezca una pequea gota de [place]. Siga las instrucciones que vienen con el medidor para: Materials engineer. Poner sangre en la tira reactiva. Obtener el resultado. Registre su resultado y las observaciones que desee. En general, sus niveles de azcar en la sangre deben ser los siguientes: 95 mg/dl (5.3 mmol/l) si no ha comido. 140 mg/dl (7.8 mmol/l) 1 hora despus de una comida. 120 mg/dl (6.7 mmol/l) 2 horas despus de una comida. Siga estas instrucciones en su casa: Medicamentos Use los medicamentos de venta libre y los recetados solamente como se lo haya indicado el mdico. Si el mdico le recet insulina u otros medicamentos para la diabetes, aplquesela o tmelos todos los das: Aplquese o tome los medicamentos  todos [place]. No se quede sin insulina o sin otros medicamentos. Planifique con antelacin para tenerlos siempre. Comida y bebida  Siga las instrucciones del mdico respecto de las restricciones en las comidas o las bebidas. Consulte a un experto en alimentacin (nutricionista) que le ayude a crear un plan de alimentacin para Forensic scientist. Los alimentos de  [place] plan deben incluir lo siguiente: Protenas con bajo contenido de grasa. Frijoles secos, frutos secos, y panes, cereales o pasta integrales. Lambert Mody y verduras frescas. Productos lcteos con bajo contenido de [place]. Grasas saludables. Ingiera refrigerios saludables entre comidas nutritivas. Beba suficiente lquido para Contractor pis (orina) de color amarillo plido. Lleve un registro de los hidratos de carbono que consume. Para hacer esto: Lea las etiquetas de los alimentos. Aprenda cules son los tamaos de las porciones de los alimentos. Siga su plan para los das de enfermedad cuando no pueda comer ni beber normalmente. United Auto plan con el mdico, de modo que est listo para usarlo. Actividad Coca [employer]a ejercicios como se lo haya indicado el mdico. Haga actividad fsica durante 30 minutos o ms por da, o durante el tiempo que el Viacom recomiende. Puede ayudar a Environmental consultant de azcar en la sangre despus de una comida si: Hace 10 minutos de actividad fsica despus de cada comida. Comienza esa actividad fsica 30 minutos despus de la comida. Hable con el mdico antes de comenzar una rutina de ejercicio nueva. Es posible que el mdico le diga que haga cambios en la [place], otros medicamentos o los alimentos. Estilo de vida No beba alcohol. No  consuma ningn producto que contenga nicotina o tabaco, como cigarrillos, cigarrillos electrnicos y tabaco de Higher education careers adviser. Si necesita ayuda para dejar de consumir estos productos, consulte al mdico. Aprenda cmo sobrellevar el estrs. Si necesita ayuda  para lograrlo, consulte al [employer]. Cuidado del cuerpo [place] vacunas al da. U.S. [employer]anc[employer]. Para hacer esto: Cepllese los dientes y [place]. Psese hilo dental una o ms veces por da. Vaya al dentista una vez cada 6 meses o con ms frecuencia. Mantenga un peso Tax adviser. Indicaciones generales Pregntele al Continental Airlines riesgos de la hipertensin arterial en el embarazo. Comparta su plan de atencin de la diabetes con: Sus compaeros de trabajo o de la escuela. Las personas con las que [place]. Hgase pruebas de orina para Product manager presencia de cetonas: Cuando est enferma. Como se lo haya indicado el mdico. Lleve consigo una tarjeta, o use un brazalete que diga que tiene diabetes. Cumpla con todas las visitas de seguimiento. Cuidados despus del parto Hgase controlar el nivel de azcar en la sangre 4 a 12 semanas despus del parto. Hgase controlar si tiene diabetes una o ms veces cada 3 aos o segn le hayan indicado. Dnde buscar ms informacin American Diabetes Association (ADA) (Asociacin Estadounidense de la Diabetes): diabetes.org Association of Diabetes Care & Education Specialists (ADCES) (Asociacin de Especialistas en Atencin y Educacin sobre la Diabetes): diabeteseducator.org Centers for Disease Control and Prevention (Centros para el Control y la Prevencin de [place], CDC): StoreMirror.com.cy American Pregnancy Association (Asociacin Americana del [place]): americanpregnancy.org U.S. Department of Holiday representative (MyPlate del Departamento de Agricultura de los EE. UU.): http://www.wilson-mendoza.org/ Comunquese con un mdico si: Su azcar en la sangre est por encima de su valor ideal en dos pruebas consecutivas. Tiene fiebre. Ha estado enferma durante 2 o ms das y no mejora. Tiene cualquiera de estos problemas durante ms de 6 horas: Vomita cada vez que come o bebe. Presenta heces lquidas (diarrea). Solicite  ayuda de inmediato si: No puede pensar con claridad. Tiene dificultad para respirar. Tiene un nivel moderado o alto de cetonas en la [place]. Le comienza a salir lquido anmalo o sangre de la vagina. Siente que el beb no se mueve tanto como es habitual. Comienza a tener contracciones de [place] temprana. Siente que el vientre se endurece. Tiene un dolor de cabeza muy intenso. Estos sntomas pueden Sales executive. Solicite ayuda de inmediato. Comunquese con el servicio de emergencias de su localidad (911 en los [place]idos). No espere a ver si los sntomas desaparecen. No conduzca por sus propios medios [employer] hospital. Resumen Controle su azcar en la sangre (glucosa) mientras est embarazada. Contrlelo con la frecuencia que le haya indicado el mdico. Aplquese la insulina y tome los medicamentos para la diabetes como se lo hayan indicado. Hgase controlar el nivel de azcar en la sangre 4 a 12 semanas despus del parto. Cumpla con todas las visitas de seguimiento. Esta informacin no tiene Marine scientist el consejo del mdico. Asegrese de hacerle al mdico cualquier pregunta que tenga. Document Revised: 03/28/2020 Document Reviewed: 03/28/2020 Elsevier Patient Education  [place]. Diabetes mellitus gestacional, diagnstico Gestational Diabetes Mellitus, Diagnosis La diabetes mellitus gestacional es una forma de diabetes. Puede manifestarse mientras est embarazada. La diabetes desaparece despus de dar a luz. Si no recibe tratamiento, esta afeccin puede causarles problemas a usted y a su beb. Cules son las causas? La causa de esta afeccin son cambios que ocurren en su cuerpo  cuando est embarazada. Cuando esos cambios suceden: Un rgano del cuerpo llamado pncreas no produce suficiente insulina. El cuerpo no puede Risk manager la insulina de la [employer] Corporation. El azcar no puede Firefighter las clulas del cuerpo. El Administrator en la sangre. Esto  provoca un nivel alto de Dispensing optician. Qu incrementa el riesgo? Ser mayor de 25 aos durante el [employer] Corporation. Tener a alguien con diabetes en la familia. Exceso de Engineer, site. [place] tenido esta afeccin antes. Sndrome del ovario poliqustico. Estar embarazada de ms de un beb. Cules son los signos o sntomas? Tener sed con frecuencia. Tener hambre con frecuencia. Tener necesidad de Garment/textile technologist con mayor frecuencia. Cmo se trata? Siga una dieta saludable. Haga ms ejercicio. Controle su nivel de azcar en la sangre con frecuencia. Aplquese insulina o tome otros medicamentos, si es necesario. Trabaje con un experto en esta afeccin, si se lo recomiendan. Siga estas instrucciones en su casa: Infrmese sobre la diabetes Pregntele al mdico lo siguiente: Con qu frecuencia debo controlarme el azcar en la sangre? Dnde obtengo el equipo? Qu medicamentos necesito? Cundo debo tomarlos? Es necesario que me rena con Radio broadcast assistant? A quin puedo llamar si tengo preguntas? Dnde puedo encontrar un grupo de apoyo? Indicaciones generales Use los medicamentos solamente como se lo haya indicado el mdico. Mantenga un peso saludable. Beba suficiente lquido como para Theatre manager la orina de color amarillo plido. Use una brazalete de alerta o lleve una tarjeta que muestre que tiene esta afeccin. Cumpla con todas las visitas de seguimiento. Dnde buscar ms informacin American Diabetes Association (ADA) (Asociacin Estadounidense de la Diabetes): diabetes.org Association of Diabetes Care & Education Specialists (ADCES) (Asociacin de Especialistas en Atencin y Educacin sobre la Diabetes): diabeteseducator.org Centers for Disease Control and Prevention (Centros para el Control y la Prevencin de [place], CDC): StoreMirror.com.cy American Pregnancy Association (Asociacin Americana del [place]): americanpregnancy.org U.S. Department of Holiday representative (MyPlate del Departamento  de Agricultura de los EE. UU.): http://www.wilson-mendoza.org/ Comunquese con un mdico si: Su nivel de azcar en la sangre es igual o mayor que 240 mg/dl (13.3 mmol/dl). Su nivel de azcar en la sangre es igual o mayor que 200 mg/dl (11.1 mmol/l), y tiene [employer]. Tiene fiebre. Ha estado enferma durante 2 o ms das y no mejora. Tiene cualquiera de estos problemas durante ms de 6 horas: Vomita cada vez que come o bebe. Presenta heces lquidas (diarrea). Solicite ayuda de inmediato si: No puede pensar con claridad. No respira bien. Tiene mucha cantidad de cetonas en la orina. El beb parece moverse menos de lo normal. Le comienza a salir lquido o sangre anmalos de la vagina. Comienza a tener contracciones antes de la fecha de [place]. Siente que el vientre se endurece. Tiene un dolor de cabeza muy intenso. Estos sntomas pueden Sales executive. Solicite ayuda de inmediato. Comunquese con el servicio de emergencias de su localidad (911 en los [place]). No espere a ver si los sntomas desaparecen. No conduzca por sus propios medios [employer] Financial. Resumen La diabetes gestacional es una forma de diabetes. Puede manifestarse mientras est embarazada. Esta afeccin ocurre cuando el cuerpo no puede producir insulina o no puede utilizarla de la UGI Corporation. Siga una dieta saludable, haga actividad fsica y use los medicamentos o la insulina como se lo haya indicado el mdico. Informe a su mdico si su nivel de azcar en la sangre es alto, tiene fiebre o vomita cada vez que come o bebe. Obtenga ayuda de inmediato  si no puede pensar con claridad, no respira bien o su beb parece moverse menos de lo normal. Esta informacin no tiene como fin reemplazar el consejo del mdico. Asegrese de hacerle al mdico cualquier pregunta que tenga. Document Revised: 03/28/2020 Document Reviewed: 03/28/2020 Elsevier Patient Education  2022 [place] trimestre de Media planner Second  Trimester of Pregnancy El segundo trimestre de Media planner va desde la semana 13 hasta la semana 27. Tambin se dice que va desde el mes 4 hasta el mes 6 de [place]. Este suele ser el momento en el que mejor se siente. Durante el segundo trimestre: Las nuseas del embarazo han disminuido o han desaparecido. Usted puede tener ms energa. Usted puede tener hambre con ms frecuencia. En esta poca, el beb en gestacin (feto) crece muy rpido. Hacia el final del sexto mes, el beb en gestacin puede medir aproximadamente 12 pulgadas y pesar alrededor de 1 libras. Es probable que comience a Surveyor, quantity beb se [employer] las 75 y las [place]. Cambios en el cuerpo durante el segundo trimestre Su organismo contina atravesando por muchos cambios durante este perodo. Los cambios varan y generalmente vuelven a la normalidad despus del nacimiento del beb. Cambios fsicos Aumentar ms peso. Podrn aparecer las primeras estras en las caderas, el vientre (abdomen) y las [place]. Las [employer] Corporation crecern y Tourist information centre manager. Pueden aparecer zonas oscuras o manchas en el rostro. Es posible que se forme una lnea oscura desde el ombligo hasta la zona del pubis (linea nigra). Tal vez haya cambios en el cabello. Cambios en la salud Es posible que tenga dolores de [place]. Es posible que tenga acidez estomacal. Es posible que tenga dificultades para defecar (estreimiento). Es posible que tenga hemorroides o venas abultadas e hinchadas (venas varicosas). Las encas pueden sangrarle. Es posible que haga pis (orine) con mayor frecuencia. Puede sentir dolor en la espalda. Siga estas instrucciones en su casa: Medicamentos Use los medicamentos de venta libre y los recetados solamente como se lo haya indicado el mdico. Algunos medicamentos no son seguros Solicitor. Tome vitaminas prenatales que contengan por lo menos 600 microgramos (mcg) de cido flico. Comida y bebida Consuma comidas  saludables que incluyan lo siguiente: Lambert Mody y verduras frescas. Cereales integrales. Buenas fuentes de protenas, como carne, huevos y tofu. Productos lcteos con bajo contenido de [place]. Evite la carne cruda y el [place], la [place] y el queso sin Radio producer. Es posible que deba tomar medidas para prevenir o tratar los problemas para defecar: Electronics engineer suficiente lquido para Contractor pis (orina) de color amarillo plido. Come alimentos ricos en fibra. Entre ellos, frijoles, cereales integrales y frutas y verduras frescas. Limitar los alimentos con alto contenido de grasa y Location manager. Estos incluyen alimentos fritos o dulces. Actividad Haga ejercicios solamente como se lo haya indicado el mdico. La mayora de las personas pueden realizar su actividad fsica habitual durante el [place]. Intente realizar como mnimo 30 minutos de actividad fsica por lo menos 5 das a la [place]. Deje de hacer ejercicio si tiene dolor o clicos en el vientre o en la zona lumbar. No haga ejercicio si hace demasiado calor, hay demasiada humedad o se encuentra en un lugar de mucha altura (altitud elevada). Evite levantar pesos [employer]. Si lo desea, puede continuar teniendo [employer], a menos que el mdico le indique lo contrario. Alivio del dolor y del Tree surgeon Use un sostn que le brinde buen soporte si le duelen las [place]. Dese baos de asiento con agua  tibia para Best boy o las molestias causadas por las hemorroides. Use una crema para las hemorroides si el mdico la autoriza. Descanse con las piernas levantadas (elevadas) si tiene calambres en las piernas o dolor en la parte baja de la espalda. Si desarrolla venas abultadas en las piernas: Use medias de compresin segn las indicaciones de su mdico. Levante los pies durante 15 minutos, 3 o 4 veces por Training and development officer. Limite la sal en sus alimentos. Seguridad Use el cinturn de seguridad en todo momento mientras vaya en auto. Hable con el mdico si  alguien le est haciendo dao o gritando [place]. Estilo de vida No se d baos de inmersin en agua caliente, baos turcos ni saunas. No se haga duchas vaginales. No use tampones ni toallas higinicas perfumadas. Evite el contacto con las bandejas sanitarias de los gatos y la tierra que estos animales usan. Estos contienen grmenes que pueden daar al beb y causar la prdida del beb ya sea aborto espontneo o muerte fetal. No consuma medicamentos a base de hierbas, drogas ilegales, ni medicamentos que el mdico no haya autorizado. No beba alcohol. No fume ni consuma ningn producto que contenga nicotina o tabaco. Si necesita ayuda para dejar de fumar, consulte al mdico. Instrucciones generales Cumpla con todas las visitas de seguimiento. Esto es importante. Consulte a su mdico acerca de dnde se dictan clases prenatales cerca de donde vive. Consulte a su mdico sobre los [employer] debe comer o pdale que la ayude a Pension scheme manager a Editor, commissioning. Dnde buscar ms informacin American Pregnancy Association (Asociacin Americana del [place]): americanpregnancy.org [employer] Corporation of Obstetricians and Gynecologists (Colegio Estadounidense de Obstetras y Gineclogos): www.acog.org Office on Home [employer]ot ([place]): KeywordPortfolios.com.br Comunquese con un mdico si: Tiene un dolor de cabeza que no desaparece despus de Teacher, adult education. Nota cambios en la visin o ve manchas delante de los ojos. Tiene clicos o siente presin o dolor leves en la parte baja del vientre. Sigue sintiendo como si fuera a vomitar (nuseas), vomita o hace deposiciones acuosas (diarrea). Advierte lquido con mal olor que proviene de la vagina. Siente dolor al orinar o hace orina con mal olor. Tiene una gran hinchazn en la cara, las manos, las piernas, los tobillos o los pies. Tiene fiebre. Solicite ayuda de inmediato si: Tiene una prdida de lquido por la vagina. Tiene sangrado o  pequeas prdidas vaginales. Tiene clicos o dolor muy intensos en el vientre. Tiene dificultad para respirar. Sientes dolor en el pecho. Se desmaya. No ha sentido que el beb se moviera durante el perodo de tiempo que le dijo el mdico. Scientist, clinical (histocompatibility and immunogenetics), hinchazn o enrojecimiento nuevos en un brazo o una pierna o se produce un aumento de alguno de estos sntomas. Resumen El segundo trimestre de embarazo va desde la semana 13 hasta la 27 (desde el mes 4 hasta el 6). Consuma comidas saludables. Haga ejercicios tal como le indic el mdico. La mayora de las personas pueden realizar su actividad fsica habitual durante el [place]. No consuma medicamentos a base de hierbas, drogas ilegales, ni medicamentos que el mdico no haya autorizado. No beba alcohol. Llame al mdico si se enferma o si nota algo inusual acerca de su embarazo. Esta informacin no tiene Marine scientist el consejo del mdico. Asegrese de hacerle al mdico cualquier pregunta que tenga. Document Revised: 03/10/2020 Document Reviewed: 03/10/2020 Elsevier Patient Education  [place].

## 2021-11-08 NOTE — Progress Notes (Signed)
Pt has glucose log today. 

## 2021-11-08 NOTE — Progress Notes (Signed)
Subjective:  ?Anna Wang is a 31 y.o. [id number] at [redacted]w[redacted]d being seen today for ongoing prenatal care. Transferred from [facility] due to GDM.  EDD by LMP and confirmed by first trimester U/S. H/O TSVD x 2 without problems.   She is currently monitored for the following issues for this high-risk pregnancy and has Supervision of high risk pregnancy in third trimester; Gestational diabetes mellitus (GDM), antepartum; and Supervision of high risk pregnancy, antepartum on their problem list. ? ?Patient reports no complaints.  Contractions: Not present. Vag. Bleeding: None.  Movement: Present. Denies leaking of fluid.  ? ?The following portions of the patient's history were reviewed and updated as appropriate: allergies, current medications, past family history, past medical history, past social history, past surgical history and problem list. Problem list updated. ? ?Objective:  ? ?Vitals:  ? 11/08/21 1022  ?Weight: 166 lb 9.6 oz (75.6 kg)  ? ? ?Fetal Status: Fetal Heart Rate (bpm): 148   Movement: Present    ? ?General:  Alert, oriented and cooperative. Patient is in no acute distress.  ?Skin: Skin is warm and dry. No rash noted.   ?Cardiovascular: Normal heart rate noted  ?Respiratory: Normal respiratory effort, no problems with respiration noted  ?Abdomen: Soft, gravid, appropriate for gestational age. Pain/Pressure: Present     ?Pelvic:  Cervical exam deferred        ?Extremities: Normal range of motion.  Edema: None  ?Mental Status: Normal mood and affect. Normal behavior. Normal judgment and thought content.  ? ?Urinalysis:     ? ?Assessment and Plan:  ?Pregnancy: [id number] at [redacted]w[redacted]d ? ?1. Supervision of high risk pregnancy in third trimester ?Stable ?Anatomy scan ordered ? ?2. Diet controlled gestational diabetes mellitus (GDM), antepartum ?Pregnancy and DM reviewed with pt ?Reduction of risks with glycemic control discussed ?CBG's not in range, particularly fasting ?Will start Metformin ?Has seen Levada Dy ?-  metFORMIN (GLUCOPHAGE) 500 MG tablet; Take 1 tablet (500 mg total) by mouth 2 (two) times daily with a meal.  Dispense: 60 tablet; Refill: 5 ?- [place] MFM OB DETAIL +14 WK; Future ? ? ?Preterm labor symptoms and general obstetric precautions including but not limited to vaginal bleeding, contractions, leaking of fluid and fetal movement were reviewed in detail with the patient. ?Please refer to After Visit Summary for other counseling recommendations.  ?Return in about 4 weeks (around 12/06/2021) for OB visit, face to face, MD only. ? ? ?Chancy Milroy, MD ?

## 2021-11-29 ENCOUNTER — Encounter: Payer: Self-pay | Admitting: *Deleted

## 2021-11-29 ENCOUNTER — Ambulatory Visit: Payer: Self-pay | Attending: Obstetrics and Gynecology

## 2021-11-29 ENCOUNTER — Other Ambulatory Visit: Payer: Self-pay | Admitting: *Deleted

## 2021-11-29 ENCOUNTER — Other Ambulatory Visit: Payer: Self-pay

## 2021-11-29 ENCOUNTER — Ambulatory Visit: Payer: Self-pay | Admitting: *Deleted

## 2021-11-29 ENCOUNTER — Telehealth: Payer: Self-pay

## 2021-11-29 VITALS — BP 110/63 | HR 79

## 2021-11-29 DIAGNOSIS — O24415 Gestational diabetes mellitus in pregnancy, controlled by oral hypoglycemic drugs: Secondary | ICD-10-CM

## 2021-11-29 DIAGNOSIS — O99212 Obesity complicating pregnancy, second trimester: Secondary | ICD-10-CM

## 2021-11-29 DIAGNOSIS — O0990 Supervision of high risk pregnancy, unspecified, unspecified trimester: Secondary | ICD-10-CM | POA: Insufficient documentation

## 2021-11-29 DIAGNOSIS — Z3A22 22 weeks gestation of pregnancy: Secondary | ICD-10-CM

## 2021-11-29 DIAGNOSIS — E669 Obesity, unspecified: Secondary | ICD-10-CM

## 2021-11-29 DIAGNOSIS — O24410 Gestational diabetes mellitus in pregnancy, diet controlled: Secondary | ICD-10-CM | POA: Insufficient documentation

## 2021-11-29 NOTE — Telephone Encounter (Signed)
Dr. Judeth Cornfield changed patient's order to have follow up growth in 4 weeks instead of 8 weeks due to GDM.  ? ?Used interpretor services and left the patient a voicemail to call back to get that appointment scheduled.  ?

## 2021-12-05 NOTE — Progress Notes (Signed)
?  Subjective:  ?Anna Wang is a 31 y.o. [id number] at [redacted]w[redacted]d being seen today for ongoing prenatal care.  She is currently monitored for the following issues for this high-risk pregnancy and has Gestational diabetes mellitus (GDM), antepartum and Supervision of high risk pregnancy, antepartum on their problem list. ? ?Patient reports no complaints.  Contractions: Not present. Vag. Bleeding: None.  Movement: Present. Denies leaking of fluid.  ? ?The following portions of the patient's history were reviewed and updated as appropriate: allergies, current medications, past family history, past medical history, past social history, past surgical history and problem list. Problem list updated. ? ?Objective:  ? ?Vitals:  ? 12/06/21 1032  ?BP: 100/67  ?Pulse: 77  ?Weight: 166 lb 9.6 oz (75.6 kg)  ? ? ?Fetal Status: Fetal Heart Rate (bpm): 142   Movement: Present    ? ?General:  Alert, oriented and cooperative. Patient is in no acute distress.  ?Skin: Skin is warm and dry. No rash noted.   ?Cardiovascular: Normal heart rate noted  ?Respiratory: Normal respiratory effort, no problems with respiration noted  ?Abdomen: Soft, gravid, appropriate for gestational age. Pain/Pressure: Present     ?Pelvic: Vag. Bleeding: None     ?Cervical exam deferred        ?Extremities: Normal range of motion.  Edema: None  ?Mental Status: Normal mood and affect. Normal behavior. Normal judgment and thought content.  ? ?Urinalysis:     ? ?Assessment and Plan:  ?Pregnancy: [id number] at [redacted]w[redacted]d ? ?1. Supervision of high risk pregnancy, antepartum ?Doing well. No concerns at this time. GDM taking Metformin. See plan below ?- follow up 4 weeks ? ?2. Gestational diabetes mellitus (GDM) in second trimester controlled on oral hypoglycemic drug ?BG log - Fasting mostly less than 90. Range 78-102. PP all less than <120. ?Last [place] on 3/23 73%ile, AFI normal. Follow up US scheduled on 12/27/21 ?- continue Metformin ? ?3. [redacted] weeks gestation of  pregnancy ? ?4. Contraception ?Considering Nexplanon or IUD. Information/handout provided ? ?Preterm labor symptoms and general obstetric precautions including but not limited to vaginal bleeding, contractions, leaking of fluid and fetal movement were reviewed in detail with the patient. ?Please refer to After Visit Summary for other counseling recommendations.  ?Return in about 4 weeks (around 01/03/2022) for LROB, needs 28 week labs. ? ?Renard Matter, MD, MPH ?OB Fellow, Faculty Practice ? ?

## 2021-12-06 ENCOUNTER — Ambulatory Visit (INDEPENDENT_AMBULATORY_CARE_PROVIDER_SITE_OTHER): Payer: Self-pay | Admitting: Family Medicine

## 2021-12-06 VITALS — BP 100/67 | HR 77 | Wt 166.6 lb

## 2021-12-06 DIAGNOSIS — O0990 Supervision of high risk pregnancy, unspecified, unspecified trimester: Secondary | ICD-10-CM

## 2021-12-06 DIAGNOSIS — O24415 Gestational diabetes mellitus in pregnancy, controlled by oral hypoglycemic drugs: Secondary | ICD-10-CM

## 2021-12-06 DIAGNOSIS — Z3A23 23 weeks gestation of pregnancy: Secondary | ICD-10-CM

## 2021-12-27 ENCOUNTER — Ambulatory Visit: Payer: Self-pay

## 2021-12-27 ENCOUNTER — Other Ambulatory Visit: Payer: Self-pay | Admitting: *Deleted

## 2021-12-27 ENCOUNTER — Ambulatory Visit: Payer: Self-pay | Attending: Obstetrics and Gynecology

## 2021-12-27 ENCOUNTER — Ambulatory Visit: Payer: Self-pay | Admitting: *Deleted

## 2021-12-27 VITALS — BP 102/65 | HR 76

## 2021-12-27 DIAGNOSIS — O24415 Gestational diabetes mellitus in pregnancy, controlled by oral hypoglycemic drugs: Secondary | ICD-10-CM | POA: Insufficient documentation

## 2021-12-27 DIAGNOSIS — E669 Obesity, unspecified: Secondary | ICD-10-CM

## 2021-12-27 DIAGNOSIS — O0990 Supervision of high risk pregnancy, unspecified, unspecified trimester: Secondary | ICD-10-CM

## 2021-12-27 DIAGNOSIS — O99212 Obesity complicating pregnancy, second trimester: Secondary | ICD-10-CM | POA: Insufficient documentation

## 2021-12-27 DIAGNOSIS — Z3A26 26 weeks gestation of pregnancy: Secondary | ICD-10-CM

## 2021-12-31 ENCOUNTER — Other Ambulatory Visit: Payer: Self-pay

## 2021-12-31 DIAGNOSIS — O0990 Supervision of high risk pregnancy, unspecified, unspecified trimester: Secondary | ICD-10-CM

## 2022-01-03 ENCOUNTER — Ambulatory Visit (INDEPENDENT_AMBULATORY_CARE_PROVIDER_SITE_OTHER): Payer: Self-pay | Admitting: Obstetrics and Gynecology

## 2022-01-03 ENCOUNTER — Encounter: Payer: Self-pay | Admitting: Obstetrics and Gynecology

## 2022-01-03 ENCOUNTER — Other Ambulatory Visit: Payer: Self-pay

## 2022-01-03 VITALS — BP 105/73 | HR 73

## 2022-01-03 DIAGNOSIS — O24415 Gestational diabetes mellitus in pregnancy, controlled by oral hypoglycemic drugs: Secondary | ICD-10-CM

## 2022-01-03 DIAGNOSIS — Z23 Encounter for immunization: Secondary | ICD-10-CM

## 2022-01-03 DIAGNOSIS — O0990 Supervision of high risk pregnancy, unspecified, unspecified trimester: Secondary | ICD-10-CM

## 2022-01-03 NOTE — Patient Instructions (Signed)
Tercer trimestre de embarazo Third Trimester of Pregnancy  El tercer trimestre de embarazo va desde la semana 28 hasta la semana 40. Tambin se dice que va desde el mes 7 hasta el mes 9. En este trimestre, el beb en gestacin (feto) crece muy rpidamente. Hacia el final del noveno mes, el beb en gestacin mide alrededor de 20 pulgadas (45 cm) de largo. Pesa entre 6 y 10 libras (2,70 y 4,50 kg). Cambios en el cuerpo durante el tercer trimestre Su organismo contina atravesando por muchos cambios durante este perodo. Los cambios varan y generalmente vuelven a la normalidad despus del nacimiento del beb. Cambios fsicos Seguir aumentando de peso. Puede ser que aumente entre 25 y 35 libras (11 y 16 kg) hacia el final del embarazo. Si tiene bajo peso, puede aumentar entre 28 y 40 lb (unos 13 a 18 kg). Si tiene sobrepeso, puede aumentar entre 15 y 25 libras (unos 7 a 11 kg). Podrn aparecer las primeras estras en las caderas, el vientre (abdomen) y las mamas. Las mamas seguirn creciendo y pueden doler. Un lquido amarillo (calostro) puede salir de sus pechos. Esta es la primera leche que usted produce para el beb. Tal vez haya cambios en el cabello. El ombligo puede salir hacia afuera. Puede observar que se le hinchan ms las manos, la cara o los tobillos. Cambios en la salud Es posible que tenga acidez estomacal. Es posible que tenga dificultades para defecar (estreimiento). Pueden aparecerle hemorroides. Estas son venas hinchadas en el ano que pueden picar o doler. Puede comenzar a tener venas hinchadas (vrices) en las piernas. Puede presentar ms dolor en la pelvis, la espalda o los muslos. Puede presentar ms hormigueo o entumecimiento en las manos, los brazos y las piernas. La piel de su vientre tambin puede sentirse entumecida. Es posible que sienta falta de aire a medida que el tero se agranda. Otros cambios Es posible que haga pis (orine) con mayor frecuencia. Puede tener ms  problemas para dormir. Puede notar que el beb en gestacin "baja" o se mueve ms hacia bajo, en el vientre. Puede notar ms secrecin proveniente de la vagina. Puede sentir las articulaciones flojas y puede sentir dolor alrededor del hueso plvico. Siga estas instrucciones en su casa: Medicamentos Use los medicamentos de venta libre y los recetados solamente como se lo haya indicado el mdico. Algunos medicamentos no son seguros durante el embarazo. Tome vitaminas prenatales que contengan por lo menos 600 microgramos (mcg) de cido flico. Comida y bebida Consuma comidas saludables que incluyan lo siguiente: Frutas y verduras frescas. Cereales integrales. Buenas fuentes de protenas, como carne, huevos y tofu. Productos lcteos con bajo contenido de grasa. Evite la carne cruda y el jugo, la leche y el queso sin pasteurizar. Estos portan grmenes que pueden provocar dao tanto a usted como al beb. Tome 4 o 5 comidas pequeas en lugar de 3 comidas abundantes al da. Es posible que deba tomar medidas para prevenir o tratar los problemas para defecar: Beber suficiente lquido para mantener el pis (orina) de color amarillo plido. Come alimentos ricos en fibra. Entre ellos, frijoles, cereales integrales y frutas y verduras frescas. Limitar los alimentos con alto contenido de grasa y azcar. Estos incluyen alimentos fritos o dulces. Actividad Haga ejercicios solamente como se lo haya indicado el mdico. Interrumpa la actividad fsica si comienza a tener clicos en el tero. Evite levantar pesos excesivos. No haga ejercicio si hace demasiado calor, hay demasiada humedad o se encuentra en un lugar de mucha   altura (altitud elevada). Si lo desea, puede continuar teniendo relaciones sexuales, a menos que el mdico le indique lo contrario. Alivio del dolor y del malestar Haga pausas con frecuencia y descanse con las piernas levantadas (elevadas) si tiene calambres en las piernas o dolor en la parte  baja de la espalda. Dese baos de asiento con agua tibia para aliviar el dolor o las molestias causadas por las hemorroides. Use una crema para las hemorroides si el mdico la autoriza. Use un sostn que le brinde buen soporte si sus mamas estn sensibles. Si desarrolla venas hinchadas y abultadas en las piernas: Use medias de compresin segn las indicaciones de su mdico. Levante los pies durante 15 minutos, 3 o 4 veces por da. Limite la sal en sus alimentos. Seguridad Hable con el mdico antes de recorrer largas distancias. No se d baos de inmersin en agua caliente, baos turcos ni saunas. Use el cinturn de seguridad en todo momento mientras vaya en auto. Hable con el mdico si alguien le est haciendo dao o gritando mucho. Preparacin para la llegada del beb Para prepararse para la llegada de su beb: Tome clases prenatales. Visite el hospital y recorra el rea de maternidad. Compre un asiento de seguridad orientado hacia atrs para llevar al beb en el automvil. Aprenda cmo instalarlo en el auto. Prepare la habitacin del beb. Saque todas las almohadas y los animales de peluche de la cuna del beb. Instrucciones generales Evite el contacto con las bandejas sanitarias de los gatos y la tierra que estos animales usan. Estos contienen grmenes que pueden daar al beb y causar la prdida del beb ya sea aborto espontneo o muerte fetal. No se haga duchas vaginales ni use tampones. No use tampones ni toallas higinicas perfumadas. No fume ni consuma ningn producto que contenga nicotina o tabaco. Si necesita ayuda para dejar de fumar, consulte al mdico. No beba alcohol. No use medicamentos a base de hierbas, drogas ilegales, ni medicamentos que el mdico no haya autorizado. Las sustancias qumicas de estos productos pueden afectar al beb. Cumpla con todas las visitas de seguimiento. Esto es importante. Dnde buscar ms informacin American Pregnancy Association (Asociacin  Americana del Embarazo): americanpregnancy.org American College of Obstetricians and Gynecologists (Colegio Estadounidense de Obstetras y Gineclogos): www.acog.org Office on Women's Health (Oficina para la Salud de la Mujer): womenshealth.gov/pregnancy Comunquese con un mdico si: Tiene fiebre. Tiene clicos leves o siente presin en la parte baja del vientre. Sufre un dolor persistente en el abdomen. Vomita o hace deposiciones acuosas (diarrea). Advierte lquido con mal olor que proviene de la vagina. Siente dolor al orinar o hace orina con mal olor. Tiene un dolor de cabeza que no desaparece despus de tomar analgsicos. Nota cambios en la visin o ve manchas delante de los ojos. Solicite ayuda de inmediato si: Rompe la bolsa. Tiene contracciones regulares separadas por menos de 5 minutos. Tiene sangrado o pequeas prdidas vaginales. Tiene clicos o dolor muy intensos en el vientre. Tiene dificultad para respirar. Sientes dolor en el pecho. Se desmaya. No ha sentido al beb moverse durante el tiempo que le indic el mdico. Tiene dolor, hinchazn o enrojecimiento nuevos en un brazo o una pierna o se produce un aumento de alguno de estos sntomas. Resumen El tercer trimestre comprende desde la semana 28 hasta la semana 40 (desde el mes 7 hasta el mes 9). Esta es la poca en que el beb en gestacin crece muy rpidamente. Durante este perodo, las molestias pueden aumentar a medida que usted   sube de peso y el beb crece. Preprese para la llegada del beb: asista a las clases prenatales, compre un asiento de seguridad orientado hacia atrs para llevar al beb en auto y prepare la habitacin del beb. Solicite ayuda de inmediato si tiene sangrado por la vagina, siente dolor en el pecho y tiene dificultad para respirar, o si no ha sentido al beb moverse durante el tiempo que le indic el mdico. Esta informacin no tiene como fin reemplazar el consejo del mdico. Asegrese de hacerle al  mdico cualquier pregunta que tenga. Document Revised: 03/08/2020 Document Reviewed: 03/08/2020 Elsevier Patient Education  2023 Elsevier Inc.  

## 2022-01-03 NOTE — Progress Notes (Signed)
Subjective:  ?Anna Wang is a 31 y.o. [id number] at [redacted]w[redacted]d being seen today for ongoing prenatal care.  She is currently monitored for the following issues for this high-risk pregnancy and has Gestational diabetes mellitus (GDM), antepartum and Supervision of high risk pregnancy, antepartum on their problem list. ? ?Patient reports general discomforts of pregnancy.  Contractions: Irregular. Vag. Bleeding: None.  Movement: Present. Denies leaking of fluid.  ? ?The following portions of the patient's history were reviewed and updated as appropriate: allergies, current medications, past family history, past medical history, past social history, past surgical history and problem list. Problem list updated. ? ?Objective:  ? ?Vitals:  ? 01/03/22 0841  ?BP: 105/73  ?Pulse: 73  ? ? ?Fetal Status:     Movement: Present    ? ?General:  Alert, oriented and cooperative. Patient is in no acute distress.  ?Skin: Skin is warm and dry. No rash noted.   ?Cardiovascular: Normal heart rate noted  ?Respiratory: Normal respiratory effort, no problems with respiration noted  ?Abdomen: Soft, gravid, appropriate for gestational age. Pain/Pressure: Absent     ?Pelvic:  Cervical exam deferred        ?Extremities: Normal range of motion.  Edema: None  ?Mental Status: Normal mood and affect. Normal behavior. Normal judgment and thought content.  ? ?Urinalysis:     ? ?Assessment and Plan:  ?Pregnancy: [id number] at [redacted]w[redacted]d ? ?1. Supervision of high risk pregnancy, antepartum ?Stable ?28 week labs minus Glucola today ?- Tdap vaccine greater than or equal to 7yo IM ? ?2. Gestational diabetes mellitus (GDM) controlled on oral hypoglycemic drug, antepartum ?CBG's in goal range except for fasting ?Will move evening dose of Metformin to qhs and increase to 1000 mg ?Last growth 79 % ?Serial growth sans and antenatal testing as per MFM ? ?Preterm labor symptoms and general obstetric precautions including but not limited to vaginal bleeding,  contractions, leaking of fluid and fetal movement were reviewed in detail with the patient. ?Please refer to After Visit Summary for other counseling recommendations.  ?Return in about 2 weeks (around 01/17/2022) for OB visit, face to face, MD only. ? ? ?Hermina Staggers, MD ?

## 2022-01-04 LAB — CBC
Hematocrit: 37.3 % (ref 34.0–46.6)
Hemoglobin: 12.9 g/dL (ref 11.1–15.9)
MCH: 29.7 pg (ref 26.6–33.0)
MCHC: 34.6 g/dL (ref 31.5–35.7)
MCV: 86 fL (ref 79–97)
Platelets: 196 10*3/uL (ref 150–450)
RBC: 4.35 x10E6/uL (ref 3.77–5.28)
RDW: 13.5 % (ref 11.7–15.4)
WBC: 6.9 10*3/uL (ref 3.4–10.8)

## 2022-01-04 LAB — HIV ANTIBODY (ROUTINE TESTING W REFLEX): HIV Screen 4th Generation wRfx: NONREACTIVE

## 2022-01-04 LAB — RPR: RPR Ser Ql: NONREACTIVE

## 2022-01-17 ENCOUNTER — Encounter: Payer: Self-pay | Admitting: Family Medicine

## 2022-01-17 ENCOUNTER — Ambulatory Visit (INDEPENDENT_AMBULATORY_CARE_PROVIDER_SITE_OTHER): Payer: Self-pay | Admitting: Family Medicine

## 2022-01-17 VITALS — BP 100/64 | HR 76 | Wt 169.0 lb

## 2022-01-17 DIAGNOSIS — O24415 Gestational diabetes mellitus in pregnancy, controlled by oral hypoglycemic drugs: Secondary | ICD-10-CM

## 2022-01-17 DIAGNOSIS — O0990 Supervision of high risk pregnancy, unspecified, unspecified trimester: Secondary | ICD-10-CM

## 2022-01-17 DIAGNOSIS — Z3A29 29 weeks gestation of pregnancy: Secondary | ICD-10-CM

## 2022-01-17 NOTE — Progress Notes (Signed)
Patient informed me that she has contractions around "2 days ago" but hasn't had any since then. Patient denies any pain and vaginal bleeding. She did not bring her glucose log today. ?

## 2022-01-17 NOTE — Progress Notes (Signed)
? ?  PRENATAL VISIT NOTE ? ?Subjective:  ?Anna Wang is a 31 y.o. [id number] at [redacted]w[redacted]d being seen today for ongoing prenatal care.  She is currently monitored for the following issues for this high-risk pregnancy and has Gestational diabetes mellitus (GDM), antepartum and Supervision of high risk pregnancy, antepartum on their problem list. ? ?Patient reports no complaints.  Contractions: Irritability. Vag. Bleeding: None.  Movement: Present. Denies leaking of fluid.  ? ?The following portions of the patient's history were reviewed and updated as appropriate: allergies, current medications, past family history, past medical history, past social history, past surgical history and problem list.  ? ?Objective:  ? ?Vitals:  ? 01/17/22 1431  ?BP: 100/64  ?Pulse: 76  ?Weight: 169 lb (76.7 kg)  ? ? ?Fetal Status: Fetal Heart Rate (bpm): 145 Fundal Height: 29 cm Movement: Present   ? ?General:  Alert, oriented and cooperative. Patient is in no acute distress.  ?Skin: Skin is warm and dry. No rash noted.   ?Cardiovascular: Normal heart rate noted.  ?Respiratory: Normal respiratory effort, no problems with respiration noted.  ?Abdomen: Soft, gravid, appropriate for gestational age.       ?Pelvic: Cervical exam deferred.  ?Extremities: Normal range of motion. No LE edema.   ?Mental Status: Normal mood and affect. Normal behavior. Normal judgment and thought content.  ? ?Assessment and Plan:  ?Pregnancy: [id number] at [redacted]w[redacted]d ? ?1. Supervision of high risk pregnancy, antepartum ?2. [redacted] weeks gestation of pregnancy ?Progressing well. FH and FHT within normal limits, VSS. No concerns today. Will follow up in 2 weeks for next HR OB visit.  ? ?3. Gestational diabetes mellitus (GDM) controlled on oral hypoglycemic drug, antepartum ?Did not bring glucose log today. Reports fasting sugars are all at 98 or lower. Her post-prandial values are all less than 120. Last growth on 4/20 with EFW 79%. Has follow up growth and antenatal  testing scheduled. Will reassess at next visit.  ? ?Preterm labor symptoms and general obstetric precautions including but not limited to vaginal bleeding, contractions, leaking of fluid and fetal movement were reviewed in detail with the patient. ? ?Please refer to After Visit Summary for other counseling recommendations.  ? ?Return in about 2 weeks (around 01/31/2022) for follow up OB visit. ? ?[employer] Spanish video interpreter Janett Billow, [phone]) used for entirety of encounter.  ? ?Future Appointments  ?Date Time Provider [place]  ?01/22/2022  8:30 AM [facility] NURSE [facility]  ?01/22/2022  8:45 AM [facility] US6 [facility]  ?02/05/2022  9:30 AM [facility] NURSE [facility]  ?02/05/2022  9:45 AM [facility] US5 [facility]  ?02/11/2022  9:30 AM [facility] NURSE [facility]  ?02/11/2022  9:45 AM [facility] US5 [facility]  ?02/11/2022 10:55 AM Aletha Halim, MD [facility]  ? ?Genia Del, MD ? ?

## 2022-01-22 ENCOUNTER — Ambulatory Visit: Payer: Self-pay

## 2022-01-24 ENCOUNTER — Ambulatory Visit: Payer: Self-pay

## 2022-02-05 ENCOUNTER — Ambulatory Visit: Payer: Self-pay | Admitting: *Deleted

## 2022-02-05 ENCOUNTER — Ambulatory Visit: Payer: Self-pay | Attending: Obstetrics and Gynecology

## 2022-02-05 ENCOUNTER — Other Ambulatory Visit: Payer: Self-pay | Admitting: *Deleted

## 2022-02-05 ENCOUNTER — Encounter: Payer: Self-pay | Admitting: *Deleted

## 2022-02-05 VITALS — BP 104/68 | HR 73

## 2022-02-05 DIAGNOSIS — O0990 Supervision of high risk pregnancy, unspecified, unspecified trimester: Secondary | ICD-10-CM

## 2022-02-05 DIAGNOSIS — O24415 Gestational diabetes mellitus in pregnancy, controlled by oral hypoglycemic drugs: Secondary | ICD-10-CM

## 2022-02-05 DIAGNOSIS — O99213 Obesity complicating pregnancy, third trimester: Secondary | ICD-10-CM

## 2022-02-05 DIAGNOSIS — Z3A32 32 weeks gestation of pregnancy: Secondary | ICD-10-CM

## 2022-02-05 DIAGNOSIS — E669 Obesity, unspecified: Secondary | ICD-10-CM

## 2022-02-11 ENCOUNTER — Encounter: Payer: Self-pay | Admitting: Obstetrics and Gynecology

## 2022-02-11 ENCOUNTER — Ambulatory Visit: Payer: Self-pay | Admitting: *Deleted

## 2022-02-11 ENCOUNTER — Ambulatory Visit: Payer: Self-pay | Attending: Obstetrics and Gynecology

## 2022-02-11 ENCOUNTER — Other Ambulatory Visit: Payer: Self-pay | Admitting: *Deleted

## 2022-02-11 ENCOUNTER — Other Ambulatory Visit: Payer: Self-pay | Admitting: Obstetrics and Gynecology

## 2022-02-11 ENCOUNTER — Ambulatory Visit (INDEPENDENT_AMBULATORY_CARE_PROVIDER_SITE_OTHER): Payer: Self-pay | Admitting: Obstetrics and Gynecology

## 2022-02-11 ENCOUNTER — Encounter: Payer: Self-pay | Admitting: *Deleted

## 2022-02-11 VITALS — BP 114/59 | HR 63

## 2022-02-11 VITALS — BP 114/80 | HR 70 | Wt 167.7 lb

## 2022-02-11 DIAGNOSIS — O283 Abnormal ultrasonic finding on antenatal screening of mother: Secondary | ICD-10-CM | POA: Insufficient documentation

## 2022-02-11 DIAGNOSIS — E669 Obesity, unspecified: Secondary | ICD-10-CM

## 2022-02-11 DIAGNOSIS — O24415 Gestational diabetes mellitus in pregnancy, controlled by oral hypoglycemic drugs: Secondary | ICD-10-CM

## 2022-02-11 DIAGNOSIS — O0990 Supervision of high risk pregnancy, unspecified, unspecified trimester: Secondary | ICD-10-CM

## 2022-02-11 DIAGNOSIS — O368390 Maternal care for abnormalities of the fetal heart rate or rhythm, unspecified trimester, not applicable or unspecified: Secondary | ICD-10-CM

## 2022-02-11 DIAGNOSIS — Z3A32 32 weeks gestation of pregnancy: Secondary | ICD-10-CM

## 2022-02-11 DIAGNOSIS — O99213 Obesity complicating pregnancy, third trimester: Secondary | ICD-10-CM

## 2022-02-11 DIAGNOSIS — Z789 Other specified health status: Secondary | ICD-10-CM

## 2022-02-11 NOTE — Procedures (Signed)
Chrislynn Martinez-Garnica [birth year] [redacted]w[redacted]d  Fetus A Non-Stress Test Interpretation for 02/11/22  Indication:  fetal arrythmia  Fetal Heart Rate A Mode: External Baseline Rate (A): 150 bpm Variability: Moderate Accelerations: 15 x 15 Decelerations: None Multiple birth?: No  Uterine Activity Contraction Frequency (min): UI Resting Tone Palpated: Relaxed  Interpretation (Fetal Testing) Nonstress Test Interpretation: Reactive Overall Impression: Reassuring for gestational age Comments: tracing reviewed by Dr. Judeth Cornfield

## 2022-02-11 NOTE — Progress Notes (Signed)
   PRENATAL VISIT NOTE  Subjective:  Anna Wang is a 31 y.o. [id number] at [redacted]w[redacted]d being seen today for ongoing prenatal care.  She is currently monitored for the following issues for this high-risk pregnancy and has Gestational diabetes mellitus (GDM), antepartum; Supervision of high risk pregnancy, antepartum; and Language barrier on their problem list.  Patient reports no complaints.  Contractions: Irritability. Vag. Bleeding: None.  Movement: Present. Denies leaking of fluid.   The following portions of the patient's history were reviewed and updated as appropriate: allergies, current medications, past family history, past medical history, past social history, past surgical history and problem list.   Objective:   Vitals:   02/11/22 1059  BP: 114/80  Pulse: 70  Weight: 167 lb 11.2 oz (76.1 kg)    Fetal Status: Fetal Heart Rate (bpm): 136   Movement: Present     General:  Alert, oriented and cooperative. Patient is in no acute distress.  Skin: Skin is warm and dry. No rash noted.   Cardiovascular: Normal heart rate noted  Respiratory: Normal respiratory effort, no problems with respiration noted  Abdomen: Soft, gravid, appropriate for gestational age.  Pain/Pressure: Absent     Pelvic: Cervical exam deferred        Extremities: Normal range of motion.  Edema: None  Mental Status: Normal mood and affect. Normal behavior. Normal judgment and thought content.   Assessment and Plan:  Pregnancy: [id number] at [redacted]w[redacted]d 1. Supervision of high risk pregnancy, antepartum Ask about BC next visit  2. Language barrier Interpter used  3. [redacted] weeks gestation of pregnancy  4. Gestational diabetes mellitus (GDM) controlled on oral hypoglycemic drug, antepartum Normal log on metformin 500/1000 (qhs). Bpp done today. F/u final read. Continue weeky testing.  5/30: breech, 44%, 1914g, ac 52%, 8/8, afi 12  Preterm labor symptoms and general obstetric precautions including but not limited  to vaginal bleeding, contractions, leaking of fluid and fetal movement were reviewed in detail with the patient. Please refer to After Visit Summary for other counseling recommendations.   Return in 15 days (on 02/26/2022) for in person, high risk ob, md visit.  Future Appointments  Date Time Provider [place]  02/19/2022  2:15 PM [facility] NST [facility]  02/19/2022  3:30 PM [facility] NURSE [facility]  02/19/2022  3:45 PM [facility]  02/26/2022 10:30 AM [facility] NURSE [facility]  02/26/2022 10:45 AM [facility] US6 [facility]  03/05/2022 10:45 AM [facility] NURSE [facility]  03/05/2022 11:00 AM [facility] US1 [facility]    Aletha Halim, MD

## 2022-02-19 ENCOUNTER — Ambulatory Visit: Payer: Self-pay | Admitting: *Deleted

## 2022-02-19 ENCOUNTER — Ambulatory Visit: Payer: Self-pay | Attending: Obstetrics and Gynecology

## 2022-02-19 VITALS — BP 102/70 | HR 60

## 2022-02-19 DIAGNOSIS — E669 Obesity, unspecified: Secondary | ICD-10-CM

## 2022-02-19 DIAGNOSIS — O99213 Obesity complicating pregnancy, third trimester: Secondary | ICD-10-CM

## 2022-02-19 DIAGNOSIS — O368330 Maternal care for abnormalities of the fetal heart rate or rhythm, third trimester, not applicable or unspecified: Secondary | ICD-10-CM

## 2022-02-19 DIAGNOSIS — O24415 Gestational diabetes mellitus in pregnancy, controlled by oral hypoglycemic drugs: Secondary | ICD-10-CM

## 2022-02-19 DIAGNOSIS — Z3A24 24 weeks gestation of pregnancy: Secondary | ICD-10-CM

## 2022-02-19 DIAGNOSIS — O368390 Maternal care for abnormalities of the fetal heart rate or rhythm, unspecified trimester, not applicable or unspecified: Secondary | ICD-10-CM | POA: Insufficient documentation

## 2022-02-19 DIAGNOSIS — O0990 Supervision of high risk pregnancy, unspecified, unspecified trimester: Secondary | ICD-10-CM

## 2022-02-19 NOTE — Procedures (Signed)
Anna Wang November 24, 1990 [redacted]w[redacted]d  Fetus A Non-Stress Test Interpretation for 02/19/22  Indication: Gestational Diabetes medication controlled  Fetal Heart Rate A Mode: External Baseline Rate (A): 135 bpm Variability: Moderate Accelerations: 15 x 15 Decelerations: None Multiple birth?: No  Uterine Activity Mode: Palpation, Toco Contraction Frequency (min): Occas UI Contraction Quality: Mild Resting Tone Palpated: Relaxed Resting Time: Adequate  Interpretation (Fetal Testing) Nonstress Test Interpretation: Reactive Comments: Dr. Parke Poisson reviewed tracing.

## 2022-02-26 ENCOUNTER — Ambulatory Visit: Payer: Self-pay | Admitting: *Deleted

## 2022-02-26 ENCOUNTER — Other Ambulatory Visit: Payer: Self-pay | Admitting: *Deleted

## 2022-02-26 ENCOUNTER — Ambulatory Visit: Payer: Self-pay | Attending: Obstetrics and Gynecology

## 2022-02-26 ENCOUNTER — Encounter: Payer: Self-pay | Admitting: *Deleted

## 2022-02-26 VITALS — BP 105/68 | HR 63

## 2022-02-26 DIAGNOSIS — O0990 Supervision of high risk pregnancy, unspecified, unspecified trimester: Secondary | ICD-10-CM

## 2022-02-26 DIAGNOSIS — Z3A35 35 weeks gestation of pregnancy: Secondary | ICD-10-CM

## 2022-02-26 DIAGNOSIS — E669 Obesity, unspecified: Secondary | ICD-10-CM

## 2022-02-26 DIAGNOSIS — O99213 Obesity complicating pregnancy, third trimester: Secondary | ICD-10-CM

## 2022-02-26 DIAGNOSIS — O24415 Gestational diabetes mellitus in pregnancy, controlled by oral hypoglycemic drugs: Secondary | ICD-10-CM | POA: Insufficient documentation

## 2022-03-01 ENCOUNTER — Ambulatory Visit (INDEPENDENT_AMBULATORY_CARE_PROVIDER_SITE_OTHER): Payer: Self-pay | Admitting: Obstetrics & Gynecology

## 2022-03-01 ENCOUNTER — Other Ambulatory Visit: Payer: Self-pay

## 2022-03-01 VITALS — BP 111/78 | HR 61 | Wt 167.5 lb

## 2022-03-01 DIAGNOSIS — O0990 Supervision of high risk pregnancy, unspecified, unspecified trimester: Secondary | ICD-10-CM

## 2022-03-01 DIAGNOSIS — O24415 Gestational diabetes mellitus in pregnancy, controlled by oral hypoglycemic drugs: Secondary | ICD-10-CM

## 2022-03-01 DIAGNOSIS — O321XX0 Maternal care for breech presentation, not applicable or unspecified: Secondary | ICD-10-CM

## 2022-03-01 DIAGNOSIS — Z789 Other specified health status: Secondary | ICD-10-CM

## 2022-03-04 ENCOUNTER — Telehealth: Payer: Self-pay

## 2022-03-05 ENCOUNTER — Encounter: Payer: Self-pay | Admitting: *Deleted

## 2022-03-05 ENCOUNTER — Ambulatory Visit: Payer: Self-pay | Admitting: *Deleted

## 2022-03-05 ENCOUNTER — Ambulatory Visit: Payer: Self-pay | Attending: Obstetrics and Gynecology

## 2022-03-05 VITALS — BP 108/66 | HR 61

## 2022-03-05 DIAGNOSIS — O0990 Supervision of high risk pregnancy, unspecified, unspecified trimester: Secondary | ICD-10-CM

## 2022-03-05 DIAGNOSIS — E669 Obesity, unspecified: Secondary | ICD-10-CM

## 2022-03-05 DIAGNOSIS — O99213 Obesity complicating pregnancy, third trimester: Secondary | ICD-10-CM

## 2022-03-05 DIAGNOSIS — O24415 Gestational diabetes mellitus in pregnancy, controlled by oral hypoglycemic drugs: Secondary | ICD-10-CM | POA: Insufficient documentation

## 2022-03-05 DIAGNOSIS — Z3A36 36 weeks gestation of pregnancy: Secondary | ICD-10-CM

## 2022-03-08 ENCOUNTER — Other Ambulatory Visit (HOSPITAL_COMMUNITY)
Admission: RE | Admit: 2022-03-08 | Discharge: 2022-03-08 | Disposition: A | Discharge status: Home / Self Care | Payer: Self-pay | Source: Ambulatory Visit | Attending: Obstetrics and Gynecology | Admitting: Obstetrics and Gynecology

## 2022-03-08 ENCOUNTER — Encounter: Payer: Self-pay | Admitting: Obstetrics and Gynecology

## 2022-03-08 ENCOUNTER — Other Ambulatory Visit: Payer: Self-pay

## 2022-03-08 ENCOUNTER — Ambulatory Visit (INDEPENDENT_AMBULATORY_CARE_PROVIDER_SITE_OTHER): Payer: Self-pay | Admitting: Obstetrics and Gynecology

## 2022-03-08 VITALS — BP 138/86 | HR 57 | Wt 168.2 lb

## 2022-03-08 DIAGNOSIS — O329XX0 Maternal care for malpresentation of fetus, unspecified, not applicable or unspecified: Secondary | ICD-10-CM

## 2022-03-08 DIAGNOSIS — Z3A36 36 weeks gestation of pregnancy: Secondary | ICD-10-CM | POA: Insufficient documentation

## 2022-03-08 DIAGNOSIS — B3731 Acute candidiasis of vulva and vagina: Secondary | ICD-10-CM

## 2022-03-08 DIAGNOSIS — O24415 Gestational diabetes mellitus in pregnancy, controlled by oral hypoglycemic drugs: Secondary | ICD-10-CM

## 2022-03-08 DIAGNOSIS — Z789 Other specified health status: Secondary | ICD-10-CM

## 2022-03-08 MED ORDER — MICONAZOLE NITRATE 2 % VA CREA: 1.0000 | TOPICAL_CREAM | Freq: Every day | VAGINAL | 2 refills | Status: DC

## 2022-03-08 NOTE — Addendum Note (Signed)
Addended by: [place] Bing on: 03/08/2022 09:25 AM   Modules accepted: Orders

## 2022-03-08 NOTE — Progress Notes (Signed)
   PRENATAL VISIT NOTE  Subjective:  Anna Wang is a 31 y.o. [id number] at [redacted]w[redacted]d being seen today for ongoing prenatal care.  She is currently monitored for the following issues for this high-risk pregnancy and has Gestational diabetes mellitus (GDM), antepartum; Supervision of high risk pregnancy, antepartum; Language barrier; and Malpresentation before onset of labor on their problem list.  Patient reports no complaints.  Contractions: Irritability. Vag. Bleeding: None.  Movement: Present. Denies leaking of fluid.   The following portions of the patient's history were reviewed and updated as appropriate: allergies, current medications, past family history, past medical history, past social history, past surgical history and problem list.   Objective:   Vitals:   03/08/22 0825  BP: 138/86  Pulse: (!) 57  Weight: 168 lb 3.2 oz (76.3 kg)    Fetal Status: Fetal Heart Rate (bpm): 150s   Movement: Present  Presentation: Complete Breech  General:  Alert, oriented and cooperative. Patient is in no acute distress.  Skin: Skin is warm and dry. No rash noted.   Cardiovascular: Normal heart rate noted  Respiratory: Normal respiratory effort, no problems with respiration noted  Abdomen: Soft, gravid, appropriate for gestational age.  Pain/Pressure: Absent     Pelvic: Cervical exam deferred        Extremities: Normal range of motion.  Edema: None  Mental Status: Normal mood and affect. Normal behavior. Normal judgment and thought content.   Assessment and Plan:  Pregnancy: [id number] at [redacted]w[redacted]d 1. [redacted] weeks gestation of pregnancy Ask if wants BTL next visit - GC/Chlamydia probe amp ([place])not at [facility] - Culture, beta strep (group b only)  2. Malpresentation before onset of labor, single or unspecified fetus Still breech on bedside u/s today. Long d/w her re: ECV (50% success, risk of abruption, NRFHT, ROM, need for stat c-section) and she would like to do ECV attempt at time for  delivery and if unsuccessful then c/s right after that. I told her that her sugars look fine for 39wks for now  3. Language barrier Interpreter used  4. Gestational diabetes mellitus (GDM) controlled on oral hypoglycemic drug, antepartum On metformin 500/1000 (qhs). Continue weekly testing.  6/27: Breech, afi 10, 8/8, 37%, 2700gm, ac 38%  Preterm labor symptoms and general obstetric precautions including but not limited to vaginal bleeding, contractions, leaking of fluid and fetal movement were reviewed in detail with the patient. Please refer to After Visit Summary for other counseling recommendations.   No follow-ups on file.  Future Appointments  Date Time Provider Department Center  03/11/2022  2:15 PM [facility] NURSE [facility]  03/11/2022  2:30 PM [facility] US3 [facility]  03/15/2022  9:15 AM [facility]  03/18/2022  2:15 PM [facility] NURSE [facility]  03/18/2022  2:30 PM [facility] US3 [facility]    [place] Bing, MD

## 2022-03-11 ENCOUNTER — Ambulatory Visit: Payer: Self-pay | Admitting: *Deleted

## 2022-03-11 ENCOUNTER — Ambulatory Visit: Payer: Self-pay | Attending: Obstetrics

## 2022-03-11 ENCOUNTER — Other Ambulatory Visit: Payer: Self-pay | Admitting: *Deleted

## 2022-03-11 ENCOUNTER — Encounter: Payer: Self-pay | Admitting: *Deleted

## 2022-03-11 VITALS — BP 99/61 | HR 76

## 2022-03-11 DIAGNOSIS — O0990 Supervision of high risk pregnancy, unspecified, unspecified trimester: Secondary | ICD-10-CM | POA: Insufficient documentation

## 2022-03-11 DIAGNOSIS — O24415 Gestational diabetes mellitus in pregnancy, controlled by oral hypoglycemic drugs: Secondary | ICD-10-CM

## 2022-03-11 DIAGNOSIS — Z3A36 36 weeks gestation of pregnancy: Secondary | ICD-10-CM

## 2022-03-11 DIAGNOSIS — O283 Abnormal ultrasonic finding on antenatal screening of mother: Secondary | ICD-10-CM

## 2022-03-11 DIAGNOSIS — E669 Obesity, unspecified: Secondary | ICD-10-CM

## 2022-03-11 DIAGNOSIS — O99213 Obesity complicating pregnancy, third trimester: Secondary | ICD-10-CM | POA: Insufficient documentation

## 2022-03-11 LAB — GC/CHLAMYDIA PROBE AMP (~~LOC~~) NOT AT ARMC
Chlamydia: NEGATIVE
Comment: NEGATIVE
Comment: NORMAL
Neisseria Gonorrhea: NEGATIVE

## 2022-03-11 NOTE — Procedures (Signed)
Anna Wang 1991-08-06 [redacted]w[redacted]d  Fetus A Non-Stress Test Interpretation for 03/11/22  Indication: Unsatisfactory BPP  Fetal Heart Rate A Mode: External Baseline Rate (A): 130 bpm Variability: Moderate Accelerations: 15 x 15 Decelerations: None Multiple birth?: No  Uterine Activity Mode: Palpation, Toco Contraction Frequency (min): none Resting Tone Palpated: Relaxed  Interpretation (Fetal Testing) Nonstress Test Interpretation: Reactive Overall Impression: Reassuring for gestational age Comments: Dr. Parke Poisson reviewed tracing

## 2022-03-12 LAB — CULTURE, BETA STREP (GROUP B ONLY): Strep Gp B Culture: NEGATIVE

## 2022-03-13 ENCOUNTER — Telehealth: Payer: Self-pay

## 2022-03-13 ENCOUNTER — Other Ambulatory Visit: Payer: Self-pay | Admitting: Obstetrics

## 2022-03-13 DIAGNOSIS — O99213 Obesity complicating pregnancy, third trimester: Secondary | ICD-10-CM

## 2022-03-13 DIAGNOSIS — O24415 Gestational diabetes mellitus in pregnancy, controlled by oral hypoglycemic drugs: Secondary | ICD-10-CM

## 2022-03-13 NOTE — Telephone Encounter (Signed)
Called patient with [employer] interpre[employer] Hesselbach ID [phone], no answer, left voicemail with surgery date, time, location and preop instructions.

## 2022-03-14 ENCOUNTER — Other Ambulatory Visit: Payer: Self-pay | Admitting: Obstetrics and Gynecology

## 2022-03-14 DIAGNOSIS — O321XX0 Maternal care for breech presentation, not applicable or unspecified: Secondary | ICD-10-CM

## 2022-03-14 NOTE — Progress Notes (Unsigned)
   PRENATAL VISIT NOTE  Subjective:  Anna Wang is a 31 y.o. [id number] at [redacted]w[redacted]d being seen today for ongoing prenatal care.  She is currently monitored for the following issues for this low-risk pregnancy and has Gestational diabetes mellitus (GDM), antepartum; Supervision of high risk pregnancy, antepartum; Language barrier; and Malpresentation before onset of labor on their problem list.  Patient reports no complaints.  Contractions: Irritability. Vag. Bleeding: None.  Movement: Present. Denies leaking of fluid.   The following portions of the patient's history were reviewed and updated as appropriate: allergies, current medications, past family history, past medical history, past social history, past surgical history and problem list.   Objective:   Vitals:   03/15/22 0925  BP: 116/86  Pulse: 64  Weight: 165 lb 4.8 oz (75 kg)    Fetal Status: Fetal Heart Rate (bpm): 130 Fundal Height: 34 cm Movement: Present  Presentation: Complete Breech  General:  Alert, oriented and cooperative. Patient is in no acute distress.  Skin: Skin is warm and dry. No rash noted.   Cardiovascular: Normal heart rate noted  Respiratory: Normal respiratory effort, no problems with respiration noted  Abdomen: Soft, gravid, appropriate for gestational age.  Pain/Pressure: Present     Pelvic: Cervical exam deferred       Declined  Extremities: Normal range of motion.  Edema: None  Mental Status: Normal mood and affect. Normal behavior. Normal judgment and thought content.   Assessment and Plan:  Pregnancy: [id number] at [redacted]w[redacted]d 1. Gestational diabetes mellitus (GDM) controlled on oral hypoglycemic drug, antepartum - CBGs mostly in range (4 Fasting upper 90's) - 37%tile at 36 weeks  2. Language barrier - Spanish interpreter used  3. Supervision of high risk pregnancy, antepartum - Routine care - GBS neg - F/U 1 week  4. [redacted] weeks gestation of pregnancy  5. Breech presentatio nat last [place]. -  F/U US 7/10 - ECV scheduled for 7/19 because pt wanted to wait as long as possible for baby to turn spontaneously, but now wants to reschedule for ASAP in hopes of greater chance of success. Requested.   Term labor symptoms and general obstetric precautions including but not limited to vaginal bleeding, contractions, leaking of fluid and fetal movement were reviewed in detail with the patient. Please refer to After Visit Summary for other counseling recommendations.   F/U 1 week    Future Appointments  Date Time Provider Department Center  03/18/2022  2:15 PM [facility] NURSE [facility]  03/18/2022  2:30 PM [facility] US3 [facility]  03/21/2022  2:35 PM Marylene Land, CNM [facility]  03/26/2022  7:30 AM [facility] NURSE [facility]  03/26/2022  7:45 AM [facility] US5 [facility]  03/27/2022  8:00 AM MC-LD SCHED ROOM MC-INDC None  04/01/2022 11:15 AM [facility] NURSE [facility]  04/01/2022 11:30 AM [facility] US2 [facility]    Dorathy Kinsman, CNM

## 2022-03-15 ENCOUNTER — Ambulatory Visit (INDEPENDENT_AMBULATORY_CARE_PROVIDER_SITE_OTHER): Payer: Self-pay | Admitting: Advanced Practice Midwife

## 2022-03-15 ENCOUNTER — Other Ambulatory Visit: Payer: Self-pay

## 2022-03-15 VITALS — BP 116/86 | HR 64 | Wt 165.3 lb

## 2022-03-15 DIAGNOSIS — Z789 Other specified health status: Secondary | ICD-10-CM

## 2022-03-15 DIAGNOSIS — Z758 Other problems related to medical facilities and other health care: Secondary | ICD-10-CM

## 2022-03-15 DIAGNOSIS — Z3A37 37 weeks gestation of pregnancy: Secondary | ICD-10-CM

## 2022-03-15 DIAGNOSIS — O0990 Supervision of high risk pregnancy, unspecified, unspecified trimester: Secondary | ICD-10-CM

## 2022-03-15 DIAGNOSIS — Z5941 Food insecurity: Secondary | ICD-10-CM

## 2022-03-15 DIAGNOSIS — O24415 Gestational diabetes mellitus in pregnancy, controlled by oral hypoglycemic drugs: Secondary | ICD-10-CM

## 2022-03-15 NOTE — Patient Instructions (Signed)
[email or web address]  for campus map and information about giving birth at [facility].

## 2022-03-18 ENCOUNTER — Ambulatory Visit: Payer: Self-pay | Attending: Obstetrics

## 2022-03-18 ENCOUNTER — Encounter: Payer: Self-pay | Admitting: *Deleted

## 2022-03-18 ENCOUNTER — Ambulatory Visit: Payer: Self-pay | Admitting: *Deleted

## 2022-03-18 VITALS — BP 118/74 | HR 60

## 2022-03-18 DIAGNOSIS — E669 Obesity, unspecified: Secondary | ICD-10-CM

## 2022-03-18 DIAGNOSIS — O99213 Obesity complicating pregnancy, third trimester: Secondary | ICD-10-CM | POA: Insufficient documentation

## 2022-03-18 DIAGNOSIS — O0990 Supervision of high risk pregnancy, unspecified, unspecified trimester: Secondary | ICD-10-CM | POA: Insufficient documentation

## 2022-03-18 DIAGNOSIS — Z3A37 37 weeks gestation of pregnancy: Secondary | ICD-10-CM

## 2022-03-18 DIAGNOSIS — O24415 Gestational diabetes mellitus in pregnancy, controlled by oral hypoglycemic drugs: Secondary | ICD-10-CM | POA: Insufficient documentation

## 2022-03-18 DIAGNOSIS — Z362 Encounter for other antenatal screening follow-up: Secondary | ICD-10-CM

## 2022-03-20 ENCOUNTER — Other Ambulatory Visit: Payer: Self-pay | Admitting: Advanced Practice Midwife

## 2022-03-21 ENCOUNTER — Ambulatory Visit (INDEPENDENT_AMBULATORY_CARE_PROVIDER_SITE_OTHER): Payer: Self-pay | Admitting: Student

## 2022-03-21 VITALS — BP 111/80 | HR 65 | Wt 166.0 lb

## 2022-03-21 DIAGNOSIS — O0993 Supervision of high risk pregnancy, unspecified, third trimester: Secondary | ICD-10-CM

## 2022-03-21 DIAGNOSIS — Z3A38 38 weeks gestation of pregnancy: Secondary | ICD-10-CM

## 2022-03-21 DIAGNOSIS — O0990 Supervision of high risk pregnancy, unspecified, unspecified trimester: Secondary | ICD-10-CM

## 2022-03-21 NOTE — Progress Notes (Signed)
   PRENATAL VISIT NOTE  Subjective:  Anna Wang is a 31 y.o. [id number] at [redacted]w[redacted]d being seen today for ongoing prenatal care.  She is currently monitored for the following issues for this high-risk pregnancy and has Gestational diabetes mellitus (GDM), antepartum; Supervision of high risk pregnancy, antepartum; Language barrier; and Malpresentation before onset of labor on their problem list.  Patient reports no complaints.  Contractions: Irritability. Vag. Bleeding: None.   . Denies leaking of fluid. She is taking her metformin as prescribed.   The following portions of the patient's history were reviewed and updated as appropriate: allergies, current medications, past family history, past medical history, past social history, past surgical history and problem list.   Objective:   Vitals:   03/21/22 1430  BP: 111/80  Pulse: 65  Weight: 166 lb (75.3 kg)    Fetal Status: Fetal Heart Rate (bpm): 138 Fundal Height: 39 cm       General:  Alert, oriented and cooperative. Patient is in no acute distress.  Skin: Skin is warm and dry. No rash noted.   Cardiovascular: Normal heart rate noted  Respiratory: Normal respiratory effort, no problems with respiration noted  Abdomen: Soft, gravid, appropriate for gestational age.  Pain/Pressure: Present     Pelvic: Cervical exam deferred        Extremities: Normal range of motion.  Edema: None  Mental Status: Normal mood and affect. Normal behavior. Normal judgment and thought content.   Assessment and Plan:  Pregnancy: [id number] at [redacted]w[redacted]d 1. [redacted] weeks gestation of pregnancy   2. Supervision of high risk pregnancy, antepartum    -reviewed Blood sugars: 2 hour PPs are all normal except for 2-3 over the past 7 days. Fastings are elevated but she believes she is not taking appropriately (waiting too late in the morning); will suggest that she take when she is truly fasting for accurate measurement.  -per patient, she is scheduled for version on  7/19 and if that is successful she will have an IOL. If not, she will have a c/section.    Term labor symptoms and general obstetric precautions including but not limited to vaginal bleeding, contractions, leaking of fluid and fetal movement were reviewed in detail with the patient. Please refer to After Visit Summary for other counseling recommendations.   No follow-ups on file.  Future Appointments  Date Time Provider Department Center  03/26/2022  7:30 AM [facility] NURSE [facility]  03/26/2022  7:45 AM [facility] US5 [facility]  03/27/2022  8:00 AM MC-LD SCHED ROOM MC-INDC None  04/01/2022 11:15 AM [facility] NURSE [facility]  04/01/2022 11:30 AM [facility] US2 [facility]    Samara Deist Gena Fray, CNM

## 2022-03-25 ENCOUNTER — Inpatient Hospital Stay (HOSPITAL_COMMUNITY): Payer: Medicaid Other | Admitting: Anesthesiology

## 2022-03-25 ENCOUNTER — Encounter (HOSPITAL_COMMUNITY)
Admission: AD | Disposition: A | Discharge status: Home / Self Care | Payer: Self-pay | Source: Home / Self Care | Attending: Obstetrics and Gynecology

## 2022-03-25 ENCOUNTER — Encounter (HOSPITAL_COMMUNITY): Payer: Self-pay | Admitting: Obstetrics and Gynecology

## 2022-03-25 ENCOUNTER — Inpatient Hospital Stay (HOSPITAL_COMMUNITY)
Admission: AD | Admit: 2022-03-25 | Discharge: 2022-03-27 | DRG: 768 | Disposition: A | Discharge status: Home / Self Care | Payer: Medicaid Other | Attending: Obstetrics and Gynecology | Admitting: Obstetrics and Gynecology

## 2022-03-25 ENCOUNTER — Other Ambulatory Visit: Payer: Self-pay

## 2022-03-25 DIAGNOSIS — O24439 Gestational diabetes mellitus in the puerperium, unspecified control: Secondary | ICD-10-CM | POA: Diagnosis not present

## 2022-03-25 DIAGNOSIS — O328XX0 Maternal care for other malpresentation of fetus, not applicable or unspecified: Secondary | ICD-10-CM | POA: Diagnosis present

## 2022-03-25 DIAGNOSIS — O24425 Gestational diabetes mellitus in childbirth, controlled by oral hypoglycemic drugs: Secondary | ICD-10-CM | POA: Diagnosis present

## 2022-03-25 DIAGNOSIS — O24419 Gestational diabetes mellitus in pregnancy, unspecified control: Secondary | ICD-10-CM | POA: Diagnosis present

## 2022-03-25 DIAGNOSIS — O1424 HELLP syndrome, complicating childbirth: Principal | ICD-10-CM | POA: Diagnosis present

## 2022-03-25 DIAGNOSIS — Z3A38 38 weeks gestation of pregnancy: Secondary | ICD-10-CM

## 2022-03-25 DIAGNOSIS — O1425 HELLP syndrome, complicating the puerperium: Secondary | ICD-10-CM | POA: Diagnosis not present

## 2022-03-25 DIAGNOSIS — O321XX0 Maternal care for breech presentation, not applicable or unspecified: Secondary | ICD-10-CM | POA: Diagnosis not present

## 2022-03-25 DIAGNOSIS — O329XX0 Maternal care for malpresentation of fetus, unspecified, not applicable or unspecified: Secondary | ICD-10-CM | POA: Diagnosis present

## 2022-03-25 DIAGNOSIS — O43223 Placenta increta, third trimester: Secondary | ICD-10-CM | POA: Diagnosis present

## 2022-03-25 DIAGNOSIS — O43219 Placenta accreta, unspecified trimester: Secondary | ICD-10-CM

## 2022-03-25 DIAGNOSIS — O0990 Supervision of high risk pregnancy, unspecified, unspecified trimester: Secondary | ICD-10-CM

## 2022-03-25 DIAGNOSIS — O321XX1 Maternal care for breech presentation, fetus 1: Secondary | ICD-10-CM

## 2022-03-25 DIAGNOSIS — Z603 Acculturation difficulty: Secondary | ICD-10-CM | POA: Diagnosis present

## 2022-03-25 DIAGNOSIS — Z302 Encounter for sterilization: Secondary | ICD-10-CM

## 2022-03-25 DIAGNOSIS — Z789 Other specified health status: Secondary | ICD-10-CM | POA: Diagnosis present

## 2022-03-25 HISTORY — DX: Placenta accreta, unspecified trimester: O43.219

## 2022-03-25 HISTORY — PX: ABDOMINAL HYSTERECTOMY: SHX81

## 2022-03-25 LAB — CBC
HCT: 45.3 % (ref 36.0–46.0)
HCT: 39.7 % (ref 36.0–46.0)
HCT: 42.4 % (ref 36.0–46.0)
Hemoglobin: 15.8 g/dL — ABNORMAL HIGH (ref 12.0–15.0)
Hemoglobin: 14.2 g/dL (ref 12.0–15.0)
Hemoglobin: 15 g/dL (ref 12.0–15.0)
MCH: 30.8 pg (ref 26.0–34.0)
MCH: 30.9 pg (ref 26.0–34.0)
MCH: 31.1 pg (ref 26.0–34.0)
MCHC: 34.9 g/dL (ref 30.0–36.0)
MCHC: 35.8 g/dL (ref 30.0–36.0)
MCHC: 35.4 g/dL (ref 30.0–36.0)
MCV: 88.3 fL (ref 80.0–100.0)
MCV: 86.5 fL (ref 80.0–100.0)
MCV: 87.8 fL (ref 80.0–100.0)
Platelets: 81 10*3/uL — ABNORMAL LOW (ref 150–400)
Platelets: 72 10*3/uL — ABNORMAL LOW (ref 150–400)
Platelets: 69 10*3/uL — ABNORMAL LOW (ref 150–400)
RBC: 5.13 MIL/uL — ABNORMAL HIGH (ref 3.87–5.11)
RBC: 4.59 MIL/uL (ref 3.87–5.11)
RBC: 4.83 MIL/uL (ref 3.87–5.11)
RDW: 13.9 % (ref 11.5–15.5)
RDW: 13.9 % (ref 11.5–15.5)
RDW: 14 % (ref 11.5–15.5)
WBC: 10.4 10*3/uL (ref 4.0–10.5)
WBC: 10.4 10*3/uL (ref 4.0–10.5)
WBC: 9 10*3/uL (ref 4.0–10.5)
nRBC: 0 % (ref 0.0–0.2)
nRBC: 0 % (ref 0.0–0.2)
nRBC: 0 % (ref 0.0–0.2)

## 2022-03-25 LAB — COMPREHENSIVE METABOLIC PANEL
ALT: 89 U/L — ABNORMAL HIGH (ref 0–44)
ALT: 91 U/L — ABNORMAL HIGH (ref 0–44)
ALT: 96 U/L — ABNORMAL HIGH (ref 0–44)
AST: 130 U/L — ABNORMAL HIGH (ref 15–41)
AST: 137 U/L — ABNORMAL HIGH (ref 15–41)
AST: 152 U/L — ABNORMAL HIGH (ref 15–41)
Albumin: 3.4 g/dL — ABNORMAL LOW (ref 3.5–5.0)
Albumin: 3 g/dL — ABNORMAL LOW (ref 3.5–5.0)
Albumin: 3.1 g/dL — ABNORMAL LOW (ref 3.5–5.0)
Alkaline Phosphatase: 155 U/L — ABNORMAL HIGH (ref 38–126)
Alkaline Phosphatase: 145 U/L — ABNORMAL HIGH (ref 38–126)
Alkaline Phosphatase: 150 U/L — ABNORMAL HIGH (ref 38–126)
Anion gap: 13 (ref 5–15)
Anion gap: 19 — ABNORMAL HIGH (ref 5–15)
Anion gap: 12 (ref 5–15)
BUN: 14 mg/dL (ref 6–20)
BUN: 14 mg/dL (ref 6–20)
BUN: 11 mg/dL (ref 6–20)
CO2: 18 mmol/L — ABNORMAL LOW (ref 22–32)
CO2: 15 mmol/L — ABNORMAL LOW (ref 22–32)
CO2: 18 mmol/L — ABNORMAL LOW (ref 22–32)
Calcium: 8.9 mg/dL (ref 8.9–10.3)
Calcium: 8.5 mg/dL — ABNORMAL LOW (ref 8.9–10.3)
Calcium: 7.7 mg/dL — ABNORMAL LOW (ref 8.9–10.3)
Chloride: 107 mmol/L (ref 98–111)
Chloride: 104 mmol/L (ref 98–111)
Chloride: 103 mmol/L (ref 98–111)
Creatinine, Ser: 0.81 mg/dL (ref 0.44–1.00)
Creatinine, Ser: 0.83 mg/dL (ref 0.44–1.00)
Creatinine, Ser: 0.85 mg/dL (ref 0.44–1.00)
GFR, Estimated: >60 mL/min (ref >60)
GFR, Estimated: >60 mL/min (ref >60)
GFR, Estimated: >60 mL/min (ref >60)
Glucose, Bld: 83 mg/dL (ref 70–99)
Glucose, Bld: 91 mg/dL (ref 70–99)
Glucose, Bld: 96 mg/dL (ref 70–99)
Potassium: 4.5 mmol/L (ref 3.5–5.1)
Potassium: 3.7 mmol/L (ref 3.5–5.1)
Potassium: 4.6 mmol/L (ref 3.5–5.1)
Sodium: 138 mmol/L (ref 135–145)
Sodium: 138 mmol/L (ref 135–145)
Sodium: 133 mmol/L — ABNORMAL LOW (ref 135–145)
Total Bilirubin: 0.9 mg/dL (ref 0.3–1.2)
Total Bilirubin: 0.9 mg/dL (ref 0.3–1.2)
Total Bilirubin: 1.1 mg/dL (ref 0.3–1.2)
Total Protein: 7.6 g/dL (ref 6.5–8.1)
Total Protein: 6.3 g/dL — ABNORMAL LOW (ref 6.5–8.1)
Total Protein: 7 g/dL (ref 6.5–8.1)

## 2022-03-25 LAB — GLUCOSE, CAPILLARY
Glucose-Capillary: 79 mg/dL (ref 70–99)
Glucose-Capillary: 88 mg/dL (ref 70–99)

## 2022-03-25 LAB — PROTEIN / CREATININE RATIO, URINE
Creatinine, Urine: 129 mg/dL
Protein Creatinine Ratio: 0.64 mg/mg{Cre} — ABNORMAL HIGH (ref 0.00–0.15)
Total Protein, Urine: 82 mg/dL

## 2022-03-25 LAB — RPR: RPR Ser Ql: NONREACTIVE

## 2022-03-25 LAB — TYPE AND SCREEN
ABO/RH(D): O POS
Antibody Screen: NEGATIVE

## 2022-03-25 LAB — HIV ANTIBODY (ROUTINE TESTING W REFLEX): HIV Screen 4th Generation wRfx: NONREACTIVE

## 2022-03-25 SURGERY — HYSTERECTOMY, ABDOMINAL
Wound class: Clean Contaminated

## 2022-03-25 MED ORDER — LACTATED RINGERS IV SOLN: INTRAVENOUS | Status: DC

## 2022-03-25 MED ORDER — FENTANYL CITRATE (PF) 100 MCG/2ML IJ SOLN
INTRAMUSCULAR | Status: AC
  Filled 2022-03-25: qty 2

## 2022-03-25 MED ORDER — LABETALOL HCL 5 MG/ML IV SOLN: 80.0000 mg | INTRAVENOUS | Status: DC | PRN

## 2022-03-25 MED ORDER — LABETALOL HCL 5 MG/ML IV SOLN: 40.0000 mg | INTRAVENOUS | Status: DC | PRN

## 2022-03-25 MED ORDER — MAGNESIUM SULFATE 40 GM/1000ML IV SOLN
INTRAVENOUS | Status: AC
  Filled 2022-03-25: qty 1000

## 2022-03-25 MED ORDER — HYDROMORPHONE HCL 1 MG/ML IJ SOLN: 0.2500 mg | INTRAMUSCULAR | Status: DC | PRN

## 2022-03-25 MED ORDER — BUPIVACAINE LIPOSOME 1.3 % IJ SUSP
INTRAMUSCULAR | Status: DC | PRN
  Administered 2022-03-25: 20 mL

## 2022-03-25 MED ORDER — TETANUS-DIPHTH-ACELL PERTUSSIS 5-2.5-18.5 LF-MCG/0.5 IM SUSY: 0.5000 mL | PREFILLED_SYRINGE | Freq: Once | INTRAMUSCULAR | Status: DC

## 2022-03-25 MED ORDER — FENTANYL-BUPIVACAINE-NACL 0.5-0.125-0.9 MG/250ML-% EP SOLN
12.0000 mL/h | EPIDURAL | Status: DC | PRN
  Administered 2022-03-25: 12 mL/h via EPIDURAL
  Filled 2022-03-25: qty 250

## 2022-03-25 MED ORDER — LACTATED RINGERS IV SOLN: INTRAVENOUS | Status: DC | PRN

## 2022-03-25 MED ORDER — MIDAZOLAM HCL 2 MG/2ML IJ SOLN
INTRAMUSCULAR | Status: AC
  Filled 2022-03-25: qty 2

## 2022-03-25 MED ORDER — PHENYLEPHRINE HCL-NACL 20-0.9 MG/250ML-% IV SOLN
INTRAVENOUS | Status: DC | PRN
  Administered 2022-03-25: 50 ug/min via INTRAVENOUS

## 2022-03-25 MED ORDER — MAGNESIUM SULFATE 40 GM/1000ML IV SOLN
2.0000 g/h | INTRAVENOUS | Status: DC
  Administered 2022-03-25: 2 g/h via INTRAVENOUS

## 2022-03-25 MED ORDER — OXYCODONE HCL 5 MG PO TABS
5.0000 mg | ORAL_TABLET | Freq: Four times a day (QID) | ORAL | Status: DC | PRN
  Administered 2022-03-26 – 2022-03-27 (×3): 10 mg via ORAL
  Filled 2022-03-25 (×3): qty 2

## 2022-03-25 MED ORDER — SENNOSIDES-DOCUSATE SODIUM 8.6-50 MG PO TABS
2.0000 | ORAL_TABLET | Freq: Every day | ORAL | Status: DC
  Administered 2022-03-26 – 2022-03-27 (×2): 2 via ORAL
  Filled 2022-03-25 (×2): qty 2

## 2022-03-25 MED ORDER — OXYTOCIN-SODIUM CHLORIDE 30-0.9 UT/500ML-% IV SOLN: 2.5000 [IU]/h | INTRAVENOUS | Status: DC

## 2022-03-25 MED ORDER — EPHEDRINE 5 MG/ML INJ
INTRAVENOUS | Status: AC
  Filled 2022-03-25: qty 5

## 2022-03-25 MED ORDER — HYDRALAZINE HCL 20 MG/ML IJ SOLN: 10.0000 mg | INTRAMUSCULAR | Status: DC | PRN

## 2022-03-25 MED ORDER — LACTATED RINGERS IV SOLN: 500.0000 mL | Freq: Once | INTRAVENOUS | Status: DC

## 2022-03-25 MED ORDER — PHENYLEPHRINE 80 MCG/ML (10ML) SYRINGE FOR IV PUSH (FOR BLOOD PRESSURE SUPPORT): 80.0000 ug | PREFILLED_SYRINGE | INTRAVENOUS | Status: DC | PRN

## 2022-03-25 MED ORDER — BUPIVACAINE HCL (PF) 0.25 % IJ SOLN
INTRAMUSCULAR | Status: DC | PRN
  Administered 2022-03-25: 1 mL via INTRATHECAL

## 2022-03-25 MED ORDER — ONDANSETRON HCL 4 MG/2ML IJ SOLN
INTRAMUSCULAR | Status: DC | PRN
  Administered 2022-03-25: 4 mg via INTRAVENOUS

## 2022-03-25 MED ORDER — PHENYLEPHRINE 80 MCG/ML (10ML) SYRINGE FOR IV PUSH (FOR BLOOD PRESSURE SUPPORT)
PREFILLED_SYRINGE | INTRAVENOUS | Status: DC | PRN
  Administered 2022-03-25: 160 ug via INTRAVENOUS

## 2022-03-25 MED ORDER — OXYTOCIN-SODIUM CHLORIDE 30-0.9 UT/500ML-% IV SOLN
1.0000 m[IU]/min | INTRAVENOUS | Status: DC
  Administered 2022-03-25: 2 m[IU]/min via INTRAVENOUS
  Filled 2022-03-25: qty 500

## 2022-03-25 MED ORDER — EPHEDRINE 5 MG/ML INJ: 10.0000 mg | INTRAVENOUS | Status: DC | PRN

## 2022-03-25 MED ORDER — SODIUM BICARBONATE 8.4 % IV SOLN
INTRAVENOUS | Status: DC | PRN
  Administered 2022-03-25: 5 mL via EPIDURAL

## 2022-03-25 MED ORDER — IBUPROFEN 600 MG PO TABS
600.0000 mg | ORAL_TABLET | Freq: Four times a day (QID) | ORAL | Status: DC
  Administered 2022-03-27 (×2): 600 mg via ORAL
  Filled 2022-03-25 (×2): qty 1

## 2022-03-25 MED ORDER — MAGNESIUM SULFATE 40 GM/1000ML IV SOLN
2.0000 g/h | INTRAVENOUS | Status: AC
  Administered 2022-03-26: 2 g/h via INTRAVENOUS
  Filled 2022-03-25: qty 1000

## 2022-03-25 MED ORDER — LACTATED RINGERS IV BOLUS
500.0000 mL | Freq: Once | INTRAVENOUS | Status: AC
  Administered 2022-03-25: 500 mL via INTRAVENOUS

## 2022-03-25 MED ORDER — DEXAMETHASONE SODIUM PHOSPHATE 10 MG/ML IJ SOLN
INTRAMUSCULAR | Status: AC
  Filled 2022-03-25: qty 1

## 2022-03-25 MED ORDER — WITCH HAZEL-GLYCERIN EX PADS: 1.0000 | MEDICATED_PAD | CUTANEOUS | Status: DC | PRN

## 2022-03-25 MED ORDER — LACTATED RINGERS IV SOLN: 500.0000 mL | INTRAVENOUS | Status: DC | PRN

## 2022-03-25 MED ORDER — POVIDONE-IODINE 10 % EX SWAB: 2.0000 | Freq: Once | CUTANEOUS | Status: DC

## 2022-03-25 MED ORDER — TERBUTALINE SULFATE 1 MG/ML IJ SOLN
0.2500 mg | Freq: Once | INTRAMUSCULAR | Status: AC
  Administered 2022-03-25: 0.25 mg via SUBCUTANEOUS

## 2022-03-25 MED ORDER — ONDANSETRON HCL 4 MG/2ML IJ SOLN
INTRAMUSCULAR | Status: AC
  Filled 2022-03-25: qty 2

## 2022-03-25 MED ORDER — DIPHENHYDRAMINE HCL 50 MG/ML IJ SOLN: 12.5000 mg | INTRAMUSCULAR | Status: DC | PRN

## 2022-03-25 MED ORDER — BUPIVACAINE HCL (PF) 0.25 % IJ SOLN
INTRAMUSCULAR | Status: DC | PRN
  Administered 2022-03-25: 8 mL via EPIDURAL

## 2022-03-25 MED ORDER — TERBUTALINE SULFATE 1 MG/ML IJ SOLN: 0.2500 mg | Freq: Once | INTRAMUSCULAR | Status: DC | PRN

## 2022-03-25 MED ORDER — SIMETHICONE 80 MG PO CHEW: 80.0000 mg | CHEWABLE_TABLET | ORAL | Status: DC | PRN

## 2022-03-25 MED ORDER — LIDOCAINE HCL (PF) 1 % IJ SOLN
INTRAMUSCULAR | Status: DC | PRN
  Administered 2022-03-25 (×2): 4 mL via EPIDURAL

## 2022-03-25 MED ORDER — LABETALOL HCL 5 MG/ML IV SOLN: 20.0000 mg | INTRAVENOUS | Status: DC | PRN

## 2022-03-25 MED ORDER — ACETAMINOPHEN 500 MG PO TABS: 1000.0000 mg | ORAL_TABLET | Freq: Once | ORAL | Status: DC

## 2022-03-25 MED ORDER — MENTHOL 3 MG MT LOZG: 1.0000 | LOZENGE | OROMUCOSAL | Status: DC | PRN

## 2022-03-25 MED ORDER — ONDANSETRON HCL 4 MG/2ML IJ SOLN: 4.0000 mg | Freq: Four times a day (QID) | INTRAMUSCULAR | Status: DC | PRN

## 2022-03-25 MED ORDER — DIBUCAINE (PERIANAL) 1 % EX OINT
1.0000 | TOPICAL_OINTMENT | CUTANEOUS | Status: DC | PRN
Start: 2022-03-25 — End: 2022-03-27

## 2022-03-25 MED ORDER — MIDAZOLAM HCL 2 MG/2ML IJ SOLN
INTRAMUSCULAR | Status: DC | PRN
  Administered 2022-03-25: 2 mg via INTRAVENOUS

## 2022-03-25 MED ORDER — MAGNESIUM SULFATE BOLUS VIA INFUSION
6.0000 g | Freq: Once | INTRAVENOUS | Status: AC
  Administered 2022-03-25: 6 g via INTRAVENOUS
  Filled 2022-03-25: qty 1000

## 2022-03-25 MED ORDER — FENTANYL CITRATE (PF) 100 MCG/2ML IJ SOLN
INTRAMUSCULAR | Status: DC | PRN
Start: 2022-03-25 — End: 2022-03-25
  Administered 2022-03-25: 15 ug via INTRATHECAL
  Administered 2022-03-25 (×2): 100 ug via INTRATHECAL

## 2022-03-25 MED ORDER — TRANEXAMIC ACID-NACL 1000-0.7 MG/100ML-% IV SOLN
1000.0000 mg | Freq: Once | INTRAVENOUS | Status: DC
Start: 2022-03-25 — End: 2022-03-27

## 2022-03-25 MED ORDER — FENTANYL CITRATE (PF) 100 MCG/2ML IJ SOLN
INTRAMUSCULAR | Status: DC | PRN
Start: 2022-03-25 — End: 2022-03-25
  Administered 2022-03-25: 100 ug via EPIDURAL

## 2022-03-25 MED ORDER — OXYCODONE-ACETAMINOPHEN 5-325 MG PO TABS: 2.0000 | ORAL_TABLET | ORAL | Status: DC | PRN

## 2022-03-25 MED ORDER — ACETAMINOPHEN 500 MG PO TABS
1000.0000 mg | ORAL_TABLET | Freq: Four times a day (QID) | ORAL | Status: DC
  Administered 2022-03-26 – 2022-03-27 (×6): 1000 mg via ORAL
  Filled 2022-03-25 (×6): qty 2

## 2022-03-25 MED ORDER — PRENATAL MULTIVITAMIN CH
1.0000 | ORAL_TABLET | Freq: Every day | ORAL | Status: DC
  Administered 2022-03-26 – 2022-03-27 (×2): 1 via ORAL
  Filled 2022-03-25 (×2): qty 1

## 2022-03-25 MED ORDER — PHENYLEPHRINE 80 MCG/ML (10ML) SYRINGE FOR IV PUSH (FOR BLOOD PRESSURE SUPPORT)
PREFILLED_SYRINGE | INTRAVENOUS | Status: AC
  Filled 2022-03-25: qty 10

## 2022-03-25 MED ORDER — LIDOCAINE HCL (PF) 1 % IJ SOLN
30.0000 mL | INTRAMUSCULAR | Status: DC | PRN
Start: 2022-03-25 — End: 2022-03-26

## 2022-03-25 MED ORDER — CEFAZOLIN SODIUM-DEXTROSE 2-4 GM/100ML-% IV SOLN
INTRAVENOUS | Status: AC
  Filled 2022-03-25: qty 100

## 2022-03-25 MED ORDER — LIDOCAINE 2% (20 MG/ML) 5 ML SYRINGE
INTRAMUSCULAR | Status: AC
  Filled 2022-03-25: qty 5

## 2022-03-25 MED ORDER — ZOLPIDEM TARTRATE 5 MG PO TABS: 5.0000 mg | ORAL_TABLET | Freq: Every evening | ORAL | Status: DC | PRN

## 2022-03-25 MED ORDER — DEXAMETHASONE SODIUM PHOSPHATE 10 MG/ML IJ SOLN
INTRAMUSCULAR | Status: DC | PRN
  Administered 2022-03-25: 10 mg via INTRAVENOUS

## 2022-03-25 MED ORDER — ACETAMINOPHEN 160 MG/5ML PO SOLN: 1000.0000 mg | Freq: Once | ORAL | Status: DC

## 2022-03-25 MED ORDER — SOD CITRATE-CITRIC ACID 500-334 MG/5ML PO SOLN
30.0000 mL | ORAL | Status: DC | PRN
  Administered 2022-03-25: 30 mL via ORAL
  Filled 2022-03-25: qty 30

## 2022-03-25 MED ORDER — FENTANYL CITRATE (PF) 250 MCG/5ML IJ SOLN
INTRAMUSCULAR | Status: DC | PRN
  Administered 2022-03-25: 100 ug via INTRAVENOUS

## 2022-03-25 MED ORDER — DROPERIDOL 2.5 MG/ML IJ SOLN
0.6250 mg | Freq: Once | INTRAMUSCULAR | Status: DC | PRN
Start: 2022-03-25 — End: 2022-03-26

## 2022-03-25 MED ORDER — KETOROLAC TROMETHAMINE 30 MG/ML IJ SOLN: 30.0000 mg | Freq: Once | INTRAMUSCULAR | Status: DC

## 2022-03-25 MED ORDER — ENOXAPARIN SODIUM 40 MG/0.4ML IJ SOSY
40.0000 mg | PREFILLED_SYRINGE | INTRAMUSCULAR | Status: DC
  Administered 2022-03-26: 40 mg via SUBCUTANEOUS
  Filled 2022-03-25: qty 0.4

## 2022-03-25 MED ORDER — BUPIVACAINE LIPOSOME 1.3 % IJ SUSP
20.0000 mL | Freq: Once | INTRAMUSCULAR | Status: DC
  Filled 2022-03-25: qty 20

## 2022-03-25 MED ORDER — TRANEXAMIC ACID-NACL 1000-0.7 MG/100ML-% IV SOLN
INTRAVENOUS | Status: AC
  Filled 2022-03-25: qty 100

## 2022-03-25 MED ORDER — LIDOCAINE-EPINEPHRINE (PF) 2 %-1:200000 IJ SOLN
INTRAMUSCULAR | Status: DC | PRN
  Administered 2022-03-25: 2 mL via EPIDURAL
  Administered 2022-03-25: 3 mL via EPIDURAL
  Administered 2022-03-25 (×2): 5 mL via EPIDURAL

## 2022-03-25 MED ORDER — CEFAZOLIN SODIUM-DEXTROSE 2-3 GM-%(50ML) IV SOLR
INTRAVENOUS | Status: DC | PRN
  Administered 2022-03-25: 2 g via INTRAVENOUS

## 2022-03-25 MED ORDER — OXYTOCIN BOLUS FROM INFUSION: 333.0000 mL | Freq: Once | INTRAVENOUS | Status: DC

## 2022-03-25 MED ORDER — OXYCODONE-ACETAMINOPHEN 5-325 MG PO TABS: 1.0000 | ORAL_TABLET | ORAL | Status: DC | PRN

## 2022-03-25 MED ORDER — KETOROLAC TROMETHAMINE 30 MG/ML IJ SOLN
30.0000 mg | Freq: Four times a day (QID) | INTRAMUSCULAR | Status: AC
  Administered 2022-03-26 – 2022-03-27 (×4): 30 mg via INTRAVENOUS
  Filled 2022-03-25 (×4): qty 1

## 2022-03-25 MED ORDER — FENTANYL CITRATE (PF) 250 MCG/5ML IJ SOLN
INTRAMUSCULAR | Status: AC
  Filled 2022-03-25: qty 5

## 2022-03-25 MED ORDER — SODIUM CHLORIDE 0.9 % IV SOLN
INTRAVENOUS | Status: AC
  Filled 2022-03-25: qty 5

## 2022-03-25 MED ORDER — ACETAMINOPHEN 325 MG PO TABS: 650.0000 mg | ORAL_TABLET | ORAL | Status: DC | PRN

## 2022-03-25 MED ORDER — PHENYLEPHRINE HCL-NACL 20-0.9 MG/250ML-% IV SOLN
INTRAVENOUS | Status: AC
  Filled 2022-03-25: qty 250

## 2022-03-25 MED ORDER — COCONUT OIL OIL: 1.0000 | TOPICAL_OIL | Status: DC | PRN

## 2022-03-25 MED ORDER — TERBUTALINE SULFATE 1 MG/ML IJ SOLN
INTRAMUSCULAR | Status: AC
  Filled 2022-03-25: qty 1

## 2022-03-25 MED ORDER — SIMETHICONE 80 MG PO CHEW
80.0000 mg | CHEWABLE_TABLET | Freq: Three times a day (TID) | ORAL | Status: DC
  Administered 2022-03-26 – 2022-03-27 (×4): 80 mg via ORAL
  Filled 2022-03-25 (×4): qty 1

## 2022-03-25 MED ORDER — DIPHENHYDRAMINE HCL 25 MG PO CAPS: 25.0000 mg | ORAL_CAPSULE | Freq: Four times a day (QID) | ORAL | Status: DC | PRN

## 2022-03-25 SURGICAL SUPPLY — 34 items
BENZOIN TINCTURE PRP APPL 2/3 (GAUZE/BANDAGES/DRESSINGS) ×3 IMPLANT
CHLORAPREP W/TINT 26ML (MISCELLANEOUS) ×6 IMPLANT
CLAMP CORD UMBIL (MISCELLANEOUS) ×3 IMPLANT
CLOTH BEACON ORANGE TIMEOUT ST (SAFETY) ×3 IMPLANT
DERMABOND ADVANCED (GAUZE/BANDAGES/DRESSINGS) ×1
DERMABOND ADVANCED .7 DNX12 (GAUZE/BANDAGES/DRESSINGS) ×1 IMPLANT
DRSG OPSITE POSTOP 4X10 (GAUZE/BANDAGES/DRESSINGS) ×3 IMPLANT
ELECT REM PT RETURN 9FT ADLT (ELECTROSURGICAL) ×3
ELECTRODE REM PT RTRN 9FT ADLT (ELECTROSURGICAL) ×2 IMPLANT
EXTRACTOR VACUUM M CUP 4 TUBE (SUCTIONS) IMPLANT
GLOVE BIOGEL PI IND STRL 7.0 (GLOVE) ×4 IMPLANT
GLOVE BIOGEL PI IND STRL 7.5 (GLOVE) ×4 IMPLANT
GLOVE BIOGEL PI INDICATOR 7.0 (GLOVE) ×2
GLOVE BIOGEL PI INDICATOR 7.5 (GLOVE) ×2
GLOVE ECLIPSE 7.5 STRL STRAW (GLOVE) ×3 IMPLANT
GOWN STRL REUS W/TWL LRG LVL3 (GOWN DISPOSABLE) ×9 IMPLANT
KIT ABG SYR 3ML LUER SLIP (SYRINGE) IMPLANT
NDL HYPO 25X5/8 SAFETYGLIDE (NEEDLE) IMPLANT
NEEDLE HYPO 25X5/8 SAFETYGLIDE (NEEDLE) IMPLANT
NS IRRIG 1000ML POUR BTL (IV SOLUTION) ×3 IMPLANT
PACK C SECTION WH (CUSTOM PROCEDURE TRAY) ×3 IMPLANT
PAD OB MATERNITY 4.3X12.25 (PERSONAL CARE ITEMS) ×3 IMPLANT
RTRCTR C-SECT PINK 25CM LRG (MISCELLANEOUS) ×3 IMPLANT
STRIP CLOSURE SKIN 1/2X4 (GAUZE/BANDAGES/DRESSINGS) ×3 IMPLANT
SUT PLAIN 0 NONE (SUTURE) ×3 IMPLANT
SUT VIC AB 0 CT1 27 (SUTURE) ×2
SUT VIC AB 0 CT1 27XCR 8 STRN (SUTURE) ×2 IMPLANT
SUT VIC AB 0 CT1 36 (SUTURE) ×9 IMPLANT
SUT VIC AB 2-0 CT1 27 (SUTURE) ×1
SUT VIC AB 2-0 CT1 TAPERPNT 27 (SUTURE) ×2 IMPLANT
SUT VIC AB 4-0 KS 27 (SUTURE) ×3 IMPLANT
TOWEL OR 17X24 6PK STRL BLUE (TOWEL DISPOSABLE) ×3 IMPLANT
TRAY FOLEY W/BAG SLVR 14FR LF (SET/KITS/TRAYS/PACK) ×3 IMPLANT
WATER STERILE IRR 1000ML POUR (IV SOLUTION) ×3 IMPLANT

## 2022-03-25 NOTE — Anesthesia Preprocedure Evaluation (Addendum)
Anesthesia Evaluation  Patient identified by MRN, date of birth, ID band Patient awake    Reviewed: Allergy & Precautions, NPO status , Patient's Chart, lab work & pertinent test results  History of Anesthesia Complications Negative for: history of anesthetic complications  Airway Mallampati: II  TM Distance: >3 FB Neck ROM: Full    Dental no notable dental hx.    Pulmonary neg pulmonary ROS,    Pulmonary exam normal        Cardiovascular negative cardio ROS Normal cardiovascular exam     Neuro/Psych negative neurological ROS  negative psych ROS   GI/Hepatic negative GI ROS, Neg liver ROS,   Endo/Other  diabetes, Gestational  Renal/GU negative Renal ROS  negative genitourinary   Musculoskeletal negative musculoskeletal ROS (+)   Abdominal   Peds  Hematology Hgb 15.8, Plt 81k   Anesthesia Other Findings Day of surgery medications reviewed with patient.  Reproductive/Obstetrics (+) Pregnancy (PreE/HELLP, breech presentation)                             Anesthesia Physical Anesthesia Plan  ASA: 3 and emergent  Anesthesia Plan: Combined Spinal and Epidural   Post-op Pain Management:    Induction:   PONV Risk Score and Plan: Treatment may vary due to age or medical condition  Airway Management Planned: Natural Airway  Additional Equipment: Fetal Monitoring  Intra-op Plan:   Post-operative Plan:   Informed Consent: I have reviewed the patients History and Physical, chart, labs and discussed the procedure including the risks, benefits and alternatives for the proposed anesthesia with the patient or authorized representative who has indicated his/her understanding and acceptance.     Interpreter used for [employer] Discussed with: CRNA  Anesthesia Plan Comments: (Spanish interpreter present for consent discussion. Case discussed with Drs. Crissie Reese and [place], plan for ECV  and IOL afterward (breech vaginal delivery if ECV unsuccessful). Discussed with patient that if she desires epidural for labor, she could have one placed now but due to thrombocytopenia (plts 81, suspected HELLP) it may not be safe to place one if her platelets continue to decline. She understands the small but present risk of epidural hematoma and prefers to have epidural placed now prior to ECV. Stephannie Peters, MD   )       Anesthesia Quick Evaluation

## 2022-03-25 NOTE — H&P (Signed)
OBSTETRIC ADMISSION HISTORY AND PHYSICAL  Anna Wang is a 31 y.o. female [phone] with IUP at [redacted]w[redacted]d by LMP presenting for early labor and found to have elevated blood pressures and now diagnosed with HELLP syndrome. Patient reports she is having some contractions but not too many.   Patient did have some elevated blood pressures while in MAU for her labor check and therefore labs were sent.   She reports +FMs, No LOF, no VB, no blurry vision, headaches or peripheral edema, and RUQ pain.  She plans on breast feeding. She request copper IUD at  for birth control. She received her prenatal care at  [facility]     Dating: By LMP --->  Estimated Date of Delivery: 04/02/22  History gathered by Dr. Crissie Reese in Spanish   Sono:   @[redacted]w[redacted]d , CWD, normal anatomy, breech presentation, anterior thickened placental lie, 2696g, 31% EFW   Prenatal History/Complications:  A2GDM - on Metformin   Past Medical History: Past Medical History:  Diagnosis Date   Gastritis    Many years ago when living in 12-26-1989   Gestational diabetes     Past Surgical History: Past Surgical History:  Procedure Laterality Date   NO PAST SURGERIES      Obstetrical History: OB History     Gravida  4   Para  2   Term  2   Preterm  0   AB  1   Living  2      SAB  1   IAB  0   Ectopic  0   Multiple  0   Live Births  2           Social History Social History   Socioeconomic History   Marital status: Significant Other    Spouse name: [place]   Number of children: 2   Years of education: 11   Highest education level: 11th grade  Occupational History   Occupation: [employer]  Tobacco Use   Smoking status: Never   Smokeless tobacco: Never  Vaping Use   Vaping Use: Never used  Substance and Sexual Activity   Alcohol use: No   Drug use: No   Sexual activity: Yes    Partners: Male    Birth control/protection: Condom    Comment: Long term boyfriend  Other Topics Concern   Not on file   Social History Narrative   Lives at home with boyfriend/father of children and 2 children.   Social Determinants of Health   Financial Resource Strain: Low Risk  (05/03/2021)   Overall Financial Resource Strain (CARDIA)    Difficulty of Paying Living Expenses: Not hard at all  Food Insecurity: Food Insecurity Present (03/21/2022)   Hunger Vital Sign    Worried About Running Out of Food in the Last Year: Sometimes true    Ran Out of Food in the Last Year: Never true  Transportation Needs: No Transportation Needs (03/21/2022)   PRAPARE - 03/23/2022 (Medical): No    Lack of Transportation (Non-Medical): No  Physical Activity: Not on file  Stress: Not on file  Social Connections: Not on file    Family History: Family History  Problem Relation Age of Onset   Drug abuse Sister        years ago--went through rehab successfully   Anxiety disorder Sister    Kidney disease Paternal Grandfather     Allergies: No Known Allergies  Medications Prior to Admission  Medication Sig Dispense Refill Last  Dose   metFORMIN (GLUCOPHAGE) 500 MG tablet Take 1 tablet (500 mg total) by mouth 2 (two) times daily with a meal. (Patient taking differently: Take 500-1,000 mg by mouth See admin instructions. Take 500 mg in the morning and 1000 mg at bedtime) 60 tablet 5 03/24/2022 at 2200   Prenatal Vit-Fe Fumarate-FA (PRENATAL MULTIVITAMIN) TABS tablet Take 1 tablet by mouth daily at 12 noon.   03/24/2022 at 1200     Review of Systems   All systems reviewed and negative except as stated in HPI  Blood pressure (!) 147/85, pulse 60, temperature 98 F (36.7 C), temperature source Oral, resp. rate 18, height 5\' 3"  (1.6 m), weight 74.3 kg, last menstrual period 06/26/2021, SpO2 98 %. General appearance: alert Lungs: clear to auscultation bilaterally Heart: regular rate and rhythm Abdomen: soft, non-tender; bowel sounds normal Extremities: Homans sign is negative, no sign of  DVT Presentation: breech Fetal monitoringBaseline: 130 bpm, Variability: Good {> 6 bpm), Accelerations: Reactive, and Decelerations: Absent Uterine activity every 3-4 min Dilation: 3 Effacement (%): 50 Station: -2 Exam by:: [email or web address], RNC   Prenatal labs: ABO, Rh: --/--/O POS (07/17 0945) Antibody: NEG (07/17 0945) Rubella: Immune (02/02 0000) RPR: Non Reactive (04/27 0900)  HBsAg: Negative (02/02 0000)  HIV: Non Reactive (07/17 0950)  GBS: Negative/-- (06/30 0838)  3 hr Glucola abnormal ( 2 hr was 175) Genetic screening  LR NIPS Anatomy 06-13-1986 normal  Prenatal Transfer Tool  Maternal Diabetes: Yes:  Diabetes Type:  Insulin/Medication controlled Genetic Screening: Normal Maternal Ultrasounds/Referrals: Normal Fetal Ultrasounds or other Referrals:  None Maternal Substance Abuse:  No Significant Maternal Medications: Metformin Significant Maternal Lab Results: Group B Strep negative  Results for orders placed or performed during the hospital encounter of 03/25/22 (from the past 24 hour(s))  Type and screen [facility]   Collection Time: 03/25/22  9:45 AM  Result Value Ref Range   ABO/RH(D) O POS    Antibody Screen NEG    Sample Expiration      03/28/2022,2359 Performed at [facility], [address] [place], [place]   CBC   Collection Time: 03/25/22  9:50 AM  Result Value Ref Range   WBC 10.4 4.0 - 10.5 K/uL   RBC 5.13 (H) 3.87 - 5.11 MIL/uL   Hemoglobin 15.8 (H) 12.0 - 15.0 g/dL   HCT 03/27/22 22.2 - 97.9 %   MCV 88.3 80.0 - 100.0 fL   MCH 30.8 26.0 - 34.0 pg   MCHC 34.9 30.0 - 36.0 g/dL   RDW 89.2 11.9 - 41.7 %   Platelets 81 (L) 150 - 400 K/uL   nRBC 0.0 0.0 - 0.2 %  Comprehensive metabolic panel   Collection Time: 03/25/22  9:50 AM  Result Value Ref Range   Sodium 138 135 - 145 mmol/L   Potassium 4.5 3.5 - 5.1 mmol/L   Chloride 107 98 - 111 mmol/L   CO2 18 (L) 22 - 32 mmol/L   Glucose, Bld 83 70 - 99 mg/dL   BUN 14 6 - 20  mg/dL   Creatinine, Ser 03/27/22 0.44 - 1.00 mg/dL   Calcium 8.9 8.9 - 1.44 mg/dL   Total Protein 7.6 6.5 - 8.1 g/dL   Albumin 3.4 (L) 3.5 - 5.0 g/dL   AST 81.8 (H) 15 - 41 U/L   ALT 89 (H) 0 - 44 U/L   Alkaline Phosphatase 155 (H) 38 - 126 U/L   Total Bilirubin 0.9 0.3 -  1.2 mg/dL   GFR, Estimated >18 >84 mL/min   Anion gap 13 5 - 15  Protein / creatinine ratio, urine   Collection Time: 03/25/22  9:50 AM  Result Value Ref Range   Creatinine, Urine 129 mg/dL   Total Protein, Urine 82 mg/dL   Protein Creatinine Ratio 0.64 (H) 0.00 - 0.15 mg/mg[Cre]  HIV Antibody (routine testing w rflx)   Collection Time: 03/25/22  9:50 AM  Result Value Ref Range   HIV Screen 4th Generation wRfx Non Reactive Non Reactive    Patient Active Problem List   Diagnosis Date Noted   Malpresentation before onset of labor 03/08/2022   Language barrier 02/11/2022   Supervision of high risk pregnancy, antepartum 11/08/2021   Gestational diabetes mellitus (GDM), antepartum 08/29/2014    Assessment/Plan:  Anna Wang is a 31 y.o. [id number] at [redacted]w[redacted]d here for early labor, breech presentation of fetus (desires ECV) and also gHTN now with HELLP syndrome  #Early labor Patient was 3 cm in MAU and fetus found to be in breech presentation. Team discussed mode of delivery options with patient and patient would like to attempt an ECV and if unsuccessful would like  to try for a vaginal breech delivery. Discussed with team and Dr. Alysia Penna on during the day and Dr.Eure on tonight and therefore able to offer vaginal breech delivery if warranted.  Once ECV attempted/done, will plan to move patient to L&D for IOL  #gHTN -->severe pre-eclampsia HELLP syndrome BP in 140s-150s.PreE labs significant for p:c of 0.6, Plt 81 (down from 196), LFT elevated (AST 130, ALT 89). -Start MG 6gm bolus/2gm infusion -Labetalol protocol for severe range BP PRN -Plan for IOL once ECV attempted  #A2GDM On Metformin. Q4hr bg here and  then q2hr  #Pain: Plans epidural #FWB: Cat I #ID:  GBS neg #MOF: breast #MOC:outpt IUD at [facility] #Circ:  No   Warner Mccreedy, MD, MPH OB Fellow, Faculty Practice

## 2022-03-25 NOTE — Progress Notes (Signed)
In person Spanish interpreter in room for encounter  Epidural is running now and patient is comfortable.  Cervix checked and ~4-5 cm/50%/-3  AROM performed with clear fluid and tolerated well patient and fetus. Continue pitocin at this time and plan for recheck in 3-4 hours or sooner if warranted  Warner Mccreedy, MD, MPH OB Fellow, Faculty Practice

## 2022-03-25 NOTE — Anesthesia Procedure Notes (Signed)
Epidural Patient location during procedure: pre-op Start time: 03/25/2022 12:14 PM End time: 03/25/2022 12:20 PM  Staffing Anesthesiologist: Kaylyn Layer, MD Performed: anesthesiologist   Preanesthetic Checklist Completed: patient identified, IV checked, risks and benefits discussed, monitors and equipment checked, pre-op evaluation and timeout performed  Epidural Patient position: sitting Prep: DuraPrep and site prepped and draped Patient monitoring: heart rate, continuous pulse ox and blood pressure Approach: midline Location: L3-L4 Injection technique: LOR air  Needle:  Needle type: Tuohy  Needle gauge: 17 G Needle length: 9 cm Needle insertion depth: 6 cm Catheter type: closed end flexible Catheter size: 19 Gauge Catheter at skin depth: 11 cm  Assessment Events: blood not aspirated, injection not painful, no injection resistance, no paresthesia and negative IV test  Additional Notes Patient identified. Risks, benefits, and alternatives discussed with patient including but not limited to bleeding, infection, nerve damage, paralysis, failed block, incomplete pain control, headache, blood pressure changes, nausea, vomiting, reactions to medication, itching, and postpartum back pain. Confirmed with bedside nurse the patient's most recent platelet count. Confirmed with patient that they are not currently taking any anticoagulation, have any bleeding history, or any family history of bleeding disorders. Patient expressed understanding and wished to proceed. All questions were answered. Sterile technique was used throughout the entire procedure. Please see nursing notes for vital signs.   Crisp LOR with Tuohy needle after one needle redirection. [place] 25g spinal needle introduced through Tuohy with clear CSF return prior to injection of intrathecal medication. Spinal needle withdrawn and epidural catheter threaded easily. Negative aspiration of catheter for heme or CSF  prior to starting epidural infusion. Warning signs of high block given to the patient including shortness of breath, tingling/numbness in hands, complete motor block, or any concerning symptoms with instructions to call for help. Patient was given instructions on fall risk and not to get out of bed. All questions and concerns addressed with instructions to call with any issues or inadequate analgesia.    Reason for block:procedure for pain

## 2022-03-25 NOTE — Progress Notes (Signed)
Assessed patient at bedside. In person Spanish interpreter in room.  Patient feeling well and some pressure.    Discussed check and AROM now and patient amenable. Her epidural catheter is still in place however not connected at this time. Patient coping well with her discomfort at this time. We discussed that once AROMed contractions would likely get more frequent and potentially more intense. Discussed with patient that options would be to AROM and then start epidural infusion once she feels more discomfort OR to start epidural now and the AROM. Patient opts for epidural now and then AROM.  Anesthesia team called by RN for epidural  Labs from 3PM reviewed: LFTs similar as before (AST 130-->137; ALT 89-->91) PLT 81--> 72  Will plan for AROM as soon as epidural started in order to facilitate delivery and get repeat labs in 4 hours  Warner Mccreedy, MD, MPH OB Fellow, Faculty Practice

## 2022-03-25 NOTE — Progress Notes (Signed)
Pt here for labor check. Breech by BSUS Dr. Crissie Reese notified and will develop POC.

## 2022-03-25 NOTE — Progress Notes (Signed)
Verbal and written consent obtained for ECV.  0.25 mg SQ terbutaline given CSE placed by anesthesia Cat I tracing present prior to attempt Breech was well engaged in the pelvis, there was a subjectively small amount of fluid, placenta was anterior fundal, fetal head was in RUQ and back was down With Dr. Ephriam Jenkins assisting the breech was easily elevated out of the pelvis, however despite three attempts at a backward roll fetal head would not move FHR was checked between attempts and was reassuring after first and second, however after the third there was a decel into the 80's that subsequently recovered. At this point the procedure was abandoned.  Presentation again confirmed to be complete breech. Patient to be transferred to L&D for planned attempt at vaginal breech delivery.

## 2022-03-25 NOTE — Progress Notes (Signed)
Patient now in L&D room after unsuccessful ECV  Will start with pitocin since team in OR. Once team out of OR will plan to AROM   Warner Mccreedy, MD, MPH OB Fellow, Faculty Practice

## 2022-03-25 NOTE — MAU Note (Addendum)
Anna Wang is a 31 y.o. at [redacted]w[redacted]d here in MAU reporting: has been contracting since 0300, now every 3-5 min.  No leaking or bleeding. Reports +FM. Has been breech, has appt on Wed to turn the baby.  Onset of complaint: 0300 Having pain in upper abd that doesn't go away Pain score: ctxs 7, upper abd (constant)8/9 Vitals:   03/25/22 0751  BP: 130/85  Pulse: 67  Resp: 16  Temp: 97.9 F (36.6 C)  SpO2: 100%     FHT:138 Lab orders placed from triage:

## 2022-03-25 NOTE — Transfer of Care (Signed)
Immediate Anesthesia Transfer of Care Note  Patient: Anna Wang  Procedure(s) Performed: HYSTERECTOMY ABDOMINAL  Patient Location: PACU  Anesthesia Type:Epidural  Level of Consciousness: awake, alert  and oriented  Airway & Oxygen Therapy: Patient Spontanous Breathing and Patient connected to face mask oxygen  Post-op Assessment: Report given to RN and Post -op Vital signs reviewed and stable  Post vital signs: Reviewed and stable  Last Vitals:  Vitals Value Taken Time  BP 92/55 03/25/22 2304  Temp    Pulse 89 03/25/22 2312  Resp 12 03/25/22 2312  SpO2 100 % 03/25/22 2312  Vitals shown include unvalidated device data.  Last Pain:  Vitals:   03/25/22 1830  TempSrc:   PainSc: 0-No pain         Complications: No notable events documented.

## 2022-03-25 NOTE — Discharge Summary (Signed)
Postpartum Discharge Summary  Date of Service updated***     Patient Name: Anna Wang DOB: 12-07-1990 MRN: [id number]  Date of admission: 03/25/2022 Delivery date:03/25/2022  Delivering provider: Florian Buff  Date of discharge: 03/25/2022  Admitting diagnosis: Breech presentation of fetus [O32.1XX0] Supervision of high risk pregnancy, antepartum [O09.90] Intrauterine pregnancy: [redacted]w[redacted]d    Secondary diagnosis:  Principal Problem:   Breech presentation of fetus Active Problems:   Gestational diabetes mellitus (GDM), antepartum   Supervision of high risk pregnancy, antepartum   Language barrier   Malpresentation before onset of labor   Breech extraction   Placenta accreta, unspecified trimester   Retained placenta  Additional problems: ***None    Discharge diagnosis: Term Pregnancy Delivered                                              Post partum procedures:*** Augmentation: AROM and Pitocin Complications: 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 yo [id number] admitted to the hospital 03/25/2022 for induction of labor.  Indication for induction:  HELLP syndrome, breech presentation/failed ECV .  Patient had anlabor course as follows: Membrane Rupture Time/Date: 4:34 PM ,03/25/2022   Delivery Method:Vaginal, Spontaneous  Episiotomy: None  Lacerations:    She had a retained placental segment and thought to be morbidly adherent and thought to be in the accreta spectrum and therefore had a post partum hysterectomy. Details of delivery can be found in separate delivery note.  Patient had a routine postpartum course. Patient is discharged home 03/25/22.  Newborn Data: Birth date:03/25/2022  Birth time:7:14 PM  Gender:Female  Living status:Living  Apgars:9 ,9  Weight:2715 g   Magnesium Sulfate received: Yes: Seizure prophylaxis BMZ received:  No Rhophylac:N/A MMR:{MMR:30440033} T-DaP:{Tdap:23962} Flu: {F{OMV:67209}ransfusion:{Transfusion received:30440034}  Physical exam  Vitals:   03/25/22 2003 03/25/22 2037 03/25/22 2047 03/25/22 2101  BP: (!) 93/59 (!) 88/49 (!) 86/51 (!) 94/59  Pulse: 79 69 65 77  Resp:      Temp:      TempSrc:      SpO2:      Weight:      Height:       General: {Exam; general:21111117} Lochia: {Desc; appropriate/inappropriate:30686::"appropriate"} Uterine Fundus: {Desc; firm/soft:30687} Incision: {Exam; incision:21111123} DVT Evaluation: {Exam; dvt:2111122} Labs: Lab Results  Component Value Date   WBC 9.0 03/25/2022   HGB 15.0 03/25/2022   HCT 42.4 03/25/2022   MCV 87.8 03/25/2022   PLT 69 (L) 03/25/2022      Latest Ref Rng & Units 03/25/2022    6:45 PM  CMP  Glucose 70 - 99 mg/dL 96   BUN 6 - 20 mg/dL 11   Creatinine 0.44 - 1.00 mg/dL 0.85   Sodium 135 - 145 mmol/L 133   Potassium 3.5 - 5.1 mmol/L 4.6   Chloride 98 - 111 mmol/L 103   CO2 22 - 32 mmol/L 18   Calcium 8.9 - 10.3 mg/dL 7.7   Total Protein 6.5 - 8.1 g/dL 7.0   Total Bilirubin 0.3 - 1.2 mg/dL 1.1   Alkaline Phos 38 - 126 U/L 150   AST 15 - 41 U/L 152   ALT 0 - 44 U/L 96    Edinburgh Score:    01/12/2019    4:50 AM  Edinburgh Postnatal Depression Scale  Screening Tool  I have been able to laugh and see the funny side of things. 0  I have looked forward with enjoyment to things. 0  I have blamed myself unnecessarily when things went wrong. 0  I have been anxious or worried for no good reason. 1  I have felt scared or panicky for no good reason. 0  Things have been getting on top of me. 1  I have been so unhappy that I have had difficulty sleeping. 0  I have felt sad or miserable. 0  I have been so unhappy that I have been crying. 0  The thought of harming myself has occurred to me. 0  Edinburgh Postnatal Depression Scale Total 2     After visit meds:  Allergies as of 03/25/2022   No Known Allergies   Med  Rec must be completed prior to using this [facility]***        Discharge home in stable condition Infant Feeding: {Baby feeding:23562} Infant Disposition:{CHL IP OB HOME WITH YYFRTM:21117} Discharge instruction: per After Visit Summary and Postpartum booklet. Activity: Advance as tolerated. Pelvic rest for 6 weeks.  Diet: {OB BVAP:01410301} Future Appointments:No future appointments. Follow up Visit: Message sent to [facility] by Dr. Cy Blamer on 7/17  Please schedule this patient for a In person postpartum visit in 4 weeks with the following provider: MD. Additional Postpartum F/U:2 hour GTT, Incision check 1 week, and BP check 1 week  High risk pregnancy complicated by: GDM and HELLP syndrome Delivery mode:  Vaginal, Spontaneous  Anticipated Birth Control:   had post partum hysterectomy   03/25/2022 Renard Matter, MD

## 2022-03-26 ENCOUNTER — Encounter (HOSPITAL_COMMUNITY): Payer: Self-pay | Admitting: Anesthesiology

## 2022-03-26 ENCOUNTER — Other Ambulatory Visit: Payer: Self-pay

## 2022-03-26 ENCOUNTER — Ambulatory Visit: Payer: Self-pay

## 2022-03-26 ENCOUNTER — Encounter (HOSPITAL_COMMUNITY): Payer: Self-pay | Admitting: Obstetrics & Gynecology

## 2022-03-26 LAB — CBC
HCT: 28.5 % — ABNORMAL LOW (ref 36.0–46.0)
HCT: 27.6 % — ABNORMAL LOW (ref 36.0–46.0)
HCT: 26.2 % — ABNORMAL LOW (ref 36.0–46.0)
HCT: 24.9 % — ABNORMAL LOW (ref 36.0–46.0)
Hemoglobin: 10.2 g/dL — ABNORMAL LOW (ref 12.0–15.0)
Hemoglobin: 10 g/dL — ABNORMAL LOW (ref 12.0–15.0)
Hemoglobin: 9.3 g/dL — ABNORMAL LOW (ref 12.0–15.0)
Hemoglobin: 9 g/dL — ABNORMAL LOW (ref 12.0–15.0)
MCH: 31 pg (ref 26.0–34.0)
MCH: 31.3 pg (ref 26.0–34.0)
MCH: 31.1 pg (ref 26.0–34.0)
MCH: 31.4 pg (ref 26.0–34.0)
MCHC: 35.8 g/dL (ref 30.0–36.0)
MCHC: 36.2 g/dL — ABNORMAL HIGH (ref 30.0–36.0)
MCHC: 35.5 g/dL (ref 30.0–36.0)
MCHC: 36.1 g/dL — ABNORMAL HIGH (ref 30.0–36.0)
MCV: 86.6 fL (ref 80.0–100.0)
MCV: 86.3 fL (ref 80.0–100.0)
MCV: 87.6 fL (ref 80.0–100.0)
MCV: 86.8 fL (ref 80.0–100.0)
Platelets: 72 10*3/uL — ABNORMAL LOW (ref 150–400)
Platelets: 87 10*3/uL — ABNORMAL LOW (ref 150–400)
Platelets: 90 10*3/uL — ABNORMAL LOW (ref 150–400)
Platelets: 102 10*3/uL — ABNORMAL LOW (ref 150–400)
RBC: 3.29 MIL/uL — ABNORMAL LOW (ref 3.87–5.11)
RBC: 3.2 MIL/uL — ABNORMAL LOW (ref 3.87–5.11)
RBC: 2.99 MIL/uL — ABNORMAL LOW (ref 3.87–5.11)
RBC: 2.87 MIL/uL — ABNORMAL LOW (ref 3.87–5.11)
RDW: 13.9 % (ref 11.5–15.5)
RDW: 13.9 % (ref 11.5–15.5)
RDW: 13.9 % (ref 11.5–15.5)
RDW: 14 % (ref 11.5–15.5)
WBC: 14.5 10*3/uL — ABNORMAL HIGH (ref 4.0–10.5)
WBC: 12 10*3/uL — ABNORMAL HIGH (ref 4.0–10.5)
WBC: 10.5 10*3/uL (ref 4.0–10.5)
WBC: 10.7 10*3/uL — ABNORMAL HIGH (ref 4.0–10.5)
nRBC: 0 % (ref 0.0–0.2)
nRBC: 0 % (ref 0.0–0.2)
nRBC: 0 % (ref 0.0–0.2)
nRBC: 0 % (ref 0.0–0.2)

## 2022-03-26 LAB — COMPREHENSIVE METABOLIC PANEL
ALT: 66 U/L — ABNORMAL HIGH (ref 0–44)
ALT: 62 U/L — ABNORMAL HIGH (ref 0–44)
AST: 89 U/L — ABNORMAL HIGH (ref 15–41)
AST: 84 U/L — ABNORMAL HIGH (ref 15–41)
Albumin: 2.4 g/dL — ABNORMAL LOW (ref 3.5–5.0)
Albumin: 2.4 g/dL — ABNORMAL LOW (ref 3.5–5.0)
Alkaline Phosphatase: 99 U/L (ref 38–126)
Alkaline Phosphatase: 102 U/L (ref 38–126)
Anion gap: 5 (ref 5–15)
Anion gap: 10 (ref 5–15)
BUN: 10 mg/dL (ref 6–20)
BUN: 11 mg/dL (ref 6–20)
CO2: 18 mmol/L — ABNORMAL LOW (ref 22–32)
CO2: 20 mmol/L — ABNORMAL LOW (ref 22–32)
Calcium: 6.2 mg/dL — CL (ref 8.9–10.3)
Calcium: 6.4 mg/dL — CL (ref 8.9–10.3)
Chloride: 107 mmol/L (ref 98–111)
Chloride: 103 mmol/L (ref 98–111)
Creatinine, Ser: 0.95 mg/dL (ref 0.44–1.00)
Creatinine, Ser: 0.82 mg/dL (ref 0.44–1.00)
GFR, Estimated: >60 mL/min (ref >60)
GFR, Estimated: >60 mL/min (ref >60)
Glucose, Bld: 155 mg/dL — ABNORMAL HIGH (ref 70–99)
Glucose, Bld: 122 mg/dL — ABNORMAL HIGH (ref 70–99)
Potassium: 4.2 mmol/L (ref 3.5–5.1)
Potassium: 4.5 mmol/L (ref 3.5–5.1)
Sodium: 130 mmol/L — ABNORMAL LOW (ref 135–145)
Sodium: 133 mmol/L — ABNORMAL LOW (ref 135–145)
Total Bilirubin: 0.6 mg/dL (ref 0.3–1.2)
Total Bilirubin: 0.6 mg/dL (ref 0.3–1.2)
Total Protein: 5.1 g/dL — ABNORMAL LOW (ref 6.5–8.1)
Total Protein: 5.3 g/dL — ABNORMAL LOW (ref 6.5–8.1)

## 2022-03-26 LAB — GLUCOSE, CAPILLARY: Glucose-Capillary: 138 mg/dL — ABNORMAL HIGH (ref 70–99)

## 2022-03-26 MED ORDER — FENTANYL CITRATE (PF) 100 MCG/2ML IJ SOLN: 50.0000 ug | INTRAMUSCULAR | Status: DC | PRN

## 2022-03-26 MED ORDER — FENTANYL CITRATE (PF) 100 MCG/2ML IJ SOLN
50.0000 ug | INTRAMUSCULAR | Status: DC | PRN
  Administered 2022-03-26: 50 ug via INTRAVENOUS
  Filled 2022-03-26: qty 2

## 2022-03-26 MED ORDER — CALCIUM GLUCONATE-NACL 2-0.675 GM/100ML-% IV SOLN
2.0000 g | Freq: Once | INTRAVENOUS | Status: AC
  Administered 2022-03-26: 2000 mg via INTRAVENOUS
  Filled 2022-03-26: qty 100

## 2022-03-26 NOTE — Lactation Note (Signed)
This note was copied from a baby's chart. Lactation Consultation Note  Patient Name: Anna Wang IFBPP'H Date: 03/26/2022 Reason for consult: Initial assessment Age:33 hours, ETI female infant. See mom's MR : hx of GDM, Pre-eclampsia and HELLP syndrome.   Spanish Interpreter used : Cicero Duck [phone] Mom's feeding choice is breastfeeding and supplementing with formula. Mom is experienced at breastfeeding see maternal data below.  LC did not observe latch at this time due to infant receiving 15 mls of formula at 1215 pm. LC set mom up with DEBP, mom was pumping while LC in room, fitted with 27 mm breast flange and mom was expressing colostrum when pumping. Mom understands colostrum is good at room temperature for 4 hours whereas formula is only 1 hour.  Mom made aware of O/P services, breastfeeding support groups, community resources, and our phone # for post-discharge questions.   Mom's current feeding plan: 1- Mom will attempt to latch infant at breast for every feeding , BF according to cues, 8 to 12+ times within 24 hours, skin to skin. 2- Mom knows to call RN/LC for latch assistance if needed. 3- After latching infant at the breast, mom will supplement infant with her EBM first and then formula, Spanish Breastfeeding supplemental sheet given. 4- If infant doesn't latch after 5 minutes or trying stop and supplement infant with EBM/formula to reserve infant's energy. 5- Mom will continue to use DEBP every 3 hours for 15 minutes on initial setting, and give infant her EBM first before formula.  Maternal Data Has patient been taught Hand Expression?: Yes Does the patient have breastfeeding experience prior to this delivery?: Yes How long did the patient breastfeed?: Per mom, she BF 1st child for 2 1/2 years and 2nd child for 2 years, mom is experienced at breastfeeding.  Feeding Mother's Current Feeding Choice: Breast Milk and Formula Nipple Type: Extra Slow Flow  LATCH Score                     Lactation Tools Discussed/Used Tools: Pump Breast pump type: Double-Electric Breast Pump Pump Education: Setup, frequency, and cleaning Reason for Pumping: Infant is ETI, per mom, few feedings infant refused breast, mom is working on latching infant at the breast. Help stimulate and establish milk supply. Pumping frequency: Mom will continue to pump every 3 hours for 15 minutes on inital setting. Pumped volume:  (Mom was expressing colostrum in breast flanges as LC left the room.)  Interventions Interventions: Breast feeding basics reviewed;Skin to skin;Expressed milk;Education;DEBP;LC Services brochure  Discharge    Consult Status Consult Status: Follow-up Date: 03/27/22 Follow-up type: In-patient    [name] 03/26/2022, 1:03 PM

## 2022-03-26 NOTE — Progress Notes (Signed)
Subjective: Postpartum Day 1 vaginal breech delivery: POD#1 Abdominal supracervical hysterectomy for MAP>Increta Patient reports no complaints.    Objective: Vital signs in last 24 hours: Temp:  [97.9 F (36.6 C)-99.8 F (37.7 C)] 98.2 F (36.8 C) (07/18 0406) Pulse Rate:  [53-119] 74 (07/18 0436) Resp:  [9-23] 17 (07/18 0600) BP: (84-160)/(49-121) 141/121 (07/18 0436) SpO2:  [97 %-100 %] 100 % (07/18 0406) Weight:  [74.3 kg] 74.3 kg (07/17 0751)  Physical Exam:  General: alert, cooperative, and no distress Lochia: appropriate Uterine Fundus: surgically absent Incision: healing well, no significant drainage, no dehiscence, no significant erythema DVT Evaluation: No evidence of DVT seen on physical exam.  Recent Labs    03/26/22 0421 03/26/22 0628  HGB 10.0* 9.3*  HCT 27.6* 26.2*    Assessment/Plan: Status post Cesarean section. Doing well postoperatively.  Continue current care. Stop magnesium tonight Anticipate home Wednesday Pre eclampsia is improving  Lazaro Arms, MD 03/26/2022, 7:48 AM  Patient ID: Anna Wang, female   DOB: Jan 27, 1991, 31 y.o.   MRN: [id number]

## 2022-03-26 NOTE — Op Note (Signed)
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 Ephriam Jenkins attended to the delivery of the placenta which occurred spontaneously about 15 minutes or so after fetal delivery with usual gush of blood and cord lengthening Gentle traction was used and the placenta was delivered Dr. Ephriam Jenkins inspected the placenta and found a large portion of the placenta was missing She did not intrauterine inspection and found an area about the size of 3 or 4 cotyledons adherent to the anterior uterus There was a tongue of tissue extending all the way to the external os from this adherent area She tried to remove it was but was unsuccessful She called me in and I did an evaluation as well I was shocked to find that this was a morbidly adherent portion of the placenta I was able to get all the way around the entire portion of the placenta but when I placed traction the uterus indented the uterus came with the tissue I spent a good 10 to 15 minutes inside the uterus manually trying to separate this tissue from the uterus but was unsuccessful I came to the conclusion that this was a morbidly adherent portion of the placenta I was aware of the patient's significant thrombocytopenia Patient was also desiring to have a postpartum bilateral tubal ligation she had completed her childbearing and her father the baby confirmed this as well My options at that  point that I considered was a D&C either with a banjo or suction curette or both but honestly I did not think it would be of any benefit because this was not a fragment or fragments that were calcified and adherent these were actually fragments that penetrated into the endometrium and/or myometrium I was concerned that if I performed an operative vaginal procedure curette suction or sharp with her thrombocytopenia that would exacerbate her potential for postpartum bleeding and I did not think it would solve the problem Luckily she was not bleeding excessively The uterus was contracted down around the other portions of the placenta that had already delivered and this portion of the placenta and uterus were not bleeding I offered the patient a postpartum hysterectomy for management I explained to her the other options that I did not think were reasonable and she agreed to proceed with the surgery  This was not a percreta because there was no evidence of the placenta on the  peritoneal surface I did not cut open the uterus but at the end of the case I had Dr. Annia Friendly go over and do a manual exploration and she confirmed the densely adherent tongue of tissue when she pulled on it caused the formation of the uterus  Seeing this I believe this was a placental increta, it seemed to be much more adherent than accreta  Description of operation: Patient was taken to the operating room placed in the supine position where she was prepped and draped in the usual sterile fashion Her epidural was dosed When adequate anesthesia was obtained Pfannenstiel skin incision was made The incision was carried down sharply to the rectus fascia The fascia was scored in the midline and extended laterally The fascia was taken off the muscle superiorly and inferiorly without difficulty manually The peritoneal cavity was entered manually The muscles and peritoneum were divided manually The uterus was delivered through the  incision The uterine cornua were grasped I stayed medial to the ovary and worked my way down bilaterally with curved Heaney clamps Each pedicle was clamped cut transfixion suture ligated and cut Several pedicles were needed to make it to the uterine vessels The uterine vessels were clamped cut transfixion suture-ligated bilaterally 2 additional pedicles were taken down the lower uterine segment medial to the uterine vessels Each pedicle was clamped cut transfixion suture ligated Per preoperative plan the cervix was crossclamped well above the bladder but certainly below the level of the uterine arteries I clamped all the way across to the cervix and remove the specimen Hemostasis was achieved with interrupted figure-of-eight sutures and good anatomical closure of the lower uterine segment Bilateral salpingectomies were performed for ovarian cancer prophylaxis The ovaries were preserved The lower cervix was preserved All pedicles were found to be hemostatic The pelvis is there irrigated vigorously The muscles and peritoneum reapproximated loosely The fascia was closed with 0 Vicryl running The subcutaneous tissue was made hemostatic and irrigated 266 mg of Exparel was injected into the subcutaneous tissue The skin was closed using 4-0 Vicryl on a Keith needle in a subcuticular fashion Dermabond was placed Honeycomb dressing was placed  The patient tolerated procedure well She experienced 200 cc of intraoperative blood loss She was taken to recovery in good stable condition all counts correct x3  Her blood loss from vaginal delivery was estimated to be about 1000 cc  So combined with her intraoperative loss total about 1200 cc  She received 2 g of Ancef intraoperatively  Lazaro Arms, MD 03/26/2022 12:37 AM

## 2022-03-26 NOTE — Anesthesia Postprocedure Evaluation (Signed)
Anesthesia Post Note  Patient: Anna Wang  Procedure(s) Performed: HYSTERECTOMY ABDOMINAL     Patient location during evaluation: PACU Anesthesia Type: Epidural Level of consciousness: awake and alert Pain management: pain level controlled Vital Signs Assessment: post-procedure vital signs reviewed and stable Respiratory status: spontaneous breathing, nonlabored ventilation and respiratory function stable Cardiovascular status: blood pressure returned to baseline Postop Assessment: epidural receding, no apparent nausea or vomiting, no headache and no backache Anesthetic complications: no   No notable events documented.  Last Vitals:  Vitals:   03/26/22 0027 03/26/22 0116  BP: 107/63 103/63  Pulse: 99 98  Resp:    Temp:    SpO2:      Last Pain:  Vitals:   03/25/22 2355  TempSrc:   PainSc: 3    Pain Goal:                   Shanda Howells

## 2022-03-27 ENCOUNTER — Observation Stay (HOSPITAL_COMMUNITY): Admission: AD | Admit: 2022-03-27 | Payer: Self-pay | Source: Home / Self Care | Admitting: Obstetrics and Gynecology

## 2022-03-27 ENCOUNTER — Observation Stay (HOSPITAL_COMMUNITY): Payer: Self-pay

## 2022-03-27 ENCOUNTER — Inpatient Hospital Stay (HOSPITAL_COMMUNITY): Admission: RE | Admit: 2022-03-27 | Payer: Self-pay | Source: Home / Self Care | Admitting: Obstetrics and Gynecology

## 2022-03-27 LAB — COMPREHENSIVE METABOLIC PANEL
ALT: 69 U/L — ABNORMAL HIGH (ref 0–44)
ALT: 48 U/L — ABNORMAL HIGH (ref 0–44)
AST: 93 U/L — ABNORMAL HIGH (ref 15–41)
AST: 52 U/L — ABNORMAL HIGH (ref 15–41)
Albumin: 2.5 g/dL — ABNORMAL LOW (ref 3.5–5.0)
Albumin: 2.6 g/dL — ABNORMAL LOW (ref 3.5–5.0)
Alkaline Phosphatase: 104 U/L (ref 38–126)
Alkaline Phosphatase: 91 U/L (ref 38–126)
Anion gap: 11 (ref 5–15)
Anion gap: 12 (ref 5–15)
BUN: 9 mg/dL (ref 6–20)
BUN: 19 mg/dL (ref 6–20)
CO2: 17 mmol/L — ABNORMAL LOW (ref 22–32)
CO2: 22 mmol/L (ref 22–32)
Calcium: 6.4 mg/dL — CL (ref 8.9–10.3)
Calcium: 7.2 mg/dL — ABNORMAL LOW (ref 8.9–10.3)
Chloride: 105 mmol/L (ref 98–111)
Chloride: 104 mmol/L (ref 98–111)
Creatinine, Ser: 0.89 mg/dL (ref 0.44–1.00)
Creatinine, Ser: 0.89 mg/dL (ref 0.44–1.00)
GFR, Estimated: >60 mL/min (ref >60)
GFR, Estimated: >60 mL/min (ref >60)
Glucose, Bld: 155 mg/dL — ABNORMAL HIGH (ref 70–99)
Glucose, Bld: 113 mg/dL — ABNORMAL HIGH (ref 70–99)
Potassium: 4.4 mmol/L (ref 3.5–5.1)
Potassium: 3.6 mmol/L (ref 3.5–5.1)
Sodium: 133 mmol/L — ABNORMAL LOW (ref 135–145)
Sodium: 138 mmol/L (ref 135–145)
Total Bilirubin: 0.6 mg/dL (ref 0.3–1.2)
Total Bilirubin: 0.4 mg/dL (ref 0.3–1.2)
Total Protein: 5.4 g/dL — ABNORMAL LOW (ref 6.5–8.1)
Total Protein: 5.6 g/dL — ABNORMAL LOW (ref 6.5–8.1)

## 2022-03-27 LAB — CBC
HCT: 23.3 % — ABNORMAL LOW (ref 36.0–46.0)
Hemoglobin: 8 g/dL — ABNORMAL LOW (ref 12.0–15.0)
MCH: 30.8 pg (ref 26.0–34.0)
MCHC: 34.3 g/dL (ref 30.0–36.0)
MCV: 89.6 fL (ref 80.0–100.0)
Platelets: 134 10*3/uL — ABNORMAL LOW (ref 150–400)
RBC: 2.6 MIL/uL — ABNORMAL LOW (ref 3.87–5.11)
RDW: 14.6 % (ref 11.5–15.5)
WBC: 10.7 10*3/uL — ABNORMAL HIGH (ref 4.0–10.5)
nRBC: 0 % (ref 0.0–0.2)

## 2022-03-27 MED ORDER — OXYCODONE HCL 5 MG PO TABS: 5.0000 mg | ORAL_TABLET | Freq: Four times a day (QID) | ORAL | 0 refills | Status: DC | PRN

## 2022-03-27 MED ORDER — IBUPROFEN 600 MG PO TABS: 600.0000 mg | ORAL_TABLET | Freq: Four times a day (QID) | ORAL | 1 refills | Status: AC

## 2022-03-27 NOTE — Progress Notes (Signed)
Discharge instructions and prescriptions given to pt with interpreter present at bedside. Discussed post c-section care, signs and symptoms to report to the MD, upcoming appointments, and meds. Pt verbalizes understanding and has no questions or concerns at this time. Pt discharged home from hospital with baby in stable condition.

## 2022-03-28 LAB — SURGICAL PATHOLOGY

## 2022-04-01 ENCOUNTER — Ambulatory Visit: Payer: Self-pay

## 2022-04-02 ENCOUNTER — Other Ambulatory Visit: Payer: Self-pay

## 2022-04-02 ENCOUNTER — Ambulatory Visit (INDEPENDENT_AMBULATORY_CARE_PROVIDER_SITE_OTHER): Payer: Self-pay

## 2022-04-02 ENCOUNTER — Telehealth (HOSPITAL_COMMUNITY): Payer: Self-pay | Admitting: *Deleted

## 2022-04-02 VITALS — BP 117/80 | HR 94 | Wt 148.1 lb

## 2022-04-02 DIAGNOSIS — Z5189 Encounter for other specified aftercare: Secondary | ICD-10-CM

## 2022-04-02 NOTE — Telephone Encounter (Signed)
Hospital discharge follow-up call completed with language line interpreter, Dondra Prader [phone]. Patient voiced no questions or concerns regarding her health at this time. Patient reported that she was seen in the office today. Reported completing  EPDS at her office visit. RN did not repeat EPDS in phone call.Patient stated that overall she is doing well.  Patient reported that she saw blood in infant's diaper yesterday near umbilical cord site. Reported that umbilical cord is still attached. Denied bleeding today. RN instructed patient to make sure that diaper is not rubbing on cord stump. Patient verbalized understanding. Patient voiced no other questions or concerns regarding infant at this time. Patient reports infant sleeps in a crib on his back. RN reviewed ABCs of safe sleep. Patient verbalized understanding. Deforest Hoyles, RN, 04/02/22, [phone]

## 2022-04-02 NOTE — Progress Notes (Signed)
Here today for wound check following abdominal hysterectomy. Pt reports she removed wound dressing yesterday. Incision appears clean, dry, and intact. Fully covered by transparent dermabond. Reviewed wound care and s/s of infection. Pt asks if she can apply any creams to wound, recommended Aquaphor healing ointment. Interpreter Byrd Hesselbach present for entire encounter. Will return for PP appt. Reviewed 2 HR GTT same day as PP visit.  Fleet Contras RN 04/02/22

## 2022-04-02 NOTE — Progress Notes (Signed)
Needs provider to change Oxy IR Rx to either 1 tablet every 4 hours or 1 tablet every 6 hours PRN. Will send to Dr Vergie Living today.  Judeth Cornfield, RNC

## 2022-04-03 ENCOUNTER — Other Ambulatory Visit: Payer: Self-pay | Admitting: Obstetrics and Gynecology

## 2022-04-04 NOTE — Progress Notes (Signed)
Call placed to pt with Interpreter Byrd Hesselbach. Spoke with pt. Pt states did not pick up Rx Oxty IR. Pt states doing well and not having any pain now since delivery. Pt advised did not need any more pain meds at this time. Will cancel Rx Oxy IR.  Rozina Pointer,RNC

## 2022-04-14 ENCOUNTER — Encounter (HOSPITAL_COMMUNITY): Payer: Self-pay | Admitting: Obstetrics & Gynecology

## 2022-05-03 ENCOUNTER — Ambulatory Visit: Payer: Self-pay | Admitting: Obstetrics & Gynecology

## 2022-05-03 ENCOUNTER — Other Ambulatory Visit: Payer: Self-pay

## 2022-05-24 ENCOUNTER — Other Ambulatory Visit: Payer: Self-pay

## 2022-05-24 DIAGNOSIS — O0990 Supervision of high risk pregnancy, unspecified, unspecified trimester: Secondary | ICD-10-CM

## 2022-05-24 DIAGNOSIS — O24415 Gestational diabetes mellitus in pregnancy, controlled by oral hypoglycemic drugs: Secondary | ICD-10-CM

## 2022-05-27 ENCOUNTER — Ambulatory Visit: Payer: Self-pay | Admitting: Obstetrics and Gynecology

## 2022-05-27 ENCOUNTER — Other Ambulatory Visit: Payer: Self-pay

## 2022-06-10 ENCOUNTER — Ambulatory Visit (INDEPENDENT_AMBULATORY_CARE_PROVIDER_SITE_OTHER): Payer: Self-pay | Admitting: Family Medicine

## 2022-06-10 ENCOUNTER — Encounter: Payer: Self-pay | Admitting: Family Medicine

## 2022-06-10 ENCOUNTER — Other Ambulatory Visit: Payer: Self-pay

## 2022-06-10 DIAGNOSIS — O24415 Gestational diabetes mellitus in pregnancy, controlled by oral hypoglycemic drugs: Secondary | ICD-10-CM

## 2022-06-10 DIAGNOSIS — Z90711 Acquired absence of uterus with remaining cervical stump: Secondary | ICD-10-CM

## 2022-06-10 DIAGNOSIS — D62 Acute posthemorrhagic anemia: Secondary | ICD-10-CM

## 2022-06-10 LAB — CBC
Hematocrit: 39.2 % (ref 34.0–46.6)
Hemoglobin: 12.6 g/dL (ref 11.1–15.9)
MCH: 27.1 pg (ref 26.6–33.0)
MCHC: 32.1 g/dL (ref 31.5–35.7)
MCV: 84 fL (ref 79–97)
Platelets: 221 10*3/uL (ref 150–450)
RBC: 4.65 x10E6/uL (ref 3.77–5.28)
RDW: 12.6 % (ref 11.7–15.4)
WBC: 6.7 10*3/uL (ref 3.4–10.8)

## 2022-06-10 NOTE — Progress Notes (Signed)
Post Partum Visit Note  Anna Wang is a 31 y.o. [phone] female who presents for a postpartum visit. She is several weeks postpartum following a normal spontaneous vaginal delivery.  I have fully reviewed the prenatal and intrapartum course. The delivery was at [redacted]w[redacted]d gestational weeks.  Anesthesia: epidural. Baby is doing well. Baby is feeding by both breast and bottle - Similac Advance. Bleeding no bleeding. Bowel function is normal. Bladder function is normal. Patient is sexually active. Contraception method is status post hysterectomy. Postpartum depression screening: negative.   The pregnancy intention screening data noted above was reviewed. Potential methods of contraception were discussed. The patient elected to proceed with No data recorded.    Health Maintenance Due  Topic Date Due   HEMOGLOBIN A1C  Never done   COVID-19 Vaccine (1) Never done   FOOT EXAM  Never done   OPHTHALMOLOGY EXAM  Never done   Hepatitis C Screening  Never done   INFLUENZA VACCINE  04/09/2022    The following portions of the patient's history were reviewed and updated as appropriate: allergies, current medications, past family history, past medical history, past social history, past surgical history, and problem list.  Review of Systems Pertinent items are noted in HPI.  Objective:  BP 117/82   Pulse 67   LMP 06/26/2021 (Exact Date)    General:  alert, cooperative, and appears stated age   Breasts:  not indicated  Lungs: clear to auscultation bilaterally  Heart:  regular rate and rhythm, S1, S2 normal, no murmur, click, rub or gallop  Abdomen: soft, non-tender; bowel sounds normal; no masses,  no organomegaly   Wound well approximated incision  GU exam:  not indicated       Assessment:   1. Anemia associated with acute blood loss - CBC  2. Gestational diabetes mellitus (GDM) controlled on oral hypoglycemic drug, antepartum 2hr GTT today Reviewed need for yearly screening  and elevated lifetime risk  3. Postpartum exam   4. S/P abdominal supracervical subtotal hysterectomy For acreta Needs pap smears per guidelines.   Normal postpartum exam.   Plan:   Essential components of care per ACOG recommendations:  1.  Mood and well being: Patient with negative depression screening today. Reviewed local resources for support.  - Patient tobacco use? No.   - hx of drug use? No.    2. Infant care and feeding:  -Patient currently breastmilk feeding? Yes. Reviewed importance of draining breast regularly to support lactation.  Patient previously nursed 2 older children exclusively but has low supply this pregnancy. Might be related to anemia/blood loss.  -Social determinants of health (SDOH) reviewed in EPIC. No concerns  3. Sexuality, contraception and birth spacing - Patient does not want a pregnancy in the next year.  Desired family size is 3 children.  - Reviewed reproductive life planning. Patient had pp abdominal hysterectomy for acreta. No longer has uterus and tubes  4. Sleep and fatigue -Encouraged family/partner/community support of 4 hrs of uninterrupted sleep to help with mood and fatigue  5. Physical Recovery  - Discussed patients delivery and complications. She describes her labor as mixed. - Patient had a Vaginal, no problems at delivery. Delivered vaginal breech baby but had adherent placenta - Patient has urinary incontinence? No. - Patient is safe to resume physical and sexual activity  6.  Health Maintenance - HM due items addressed Yes - Last pap smear 10/2021/. NIL. Next in 2026. Pap smear not done at today's visit.  -  Breast Cancer screening indicated? No.   7. Chronic Disease/Pregnancy Condition follow up:  Anemia and A2GDM   - PCP follow up  Caren Macadam, [place] for [place], [place]

## 2022-06-11 LAB — GLUCOSE TOLERANCE, 2 HOURS
Glucose, 2 hour: 127 mg/dL (ref 70–139)
Glucose, GTT - Fasting: 92 mg/dL (ref 70–99)

## 2023-07-12 IMAGING — US US MFM FETAL BPP W/O NON-STRESS
1 series · 13 of 28 positions shown · non-contrast
Comparison: none

[Series 1: us mfm fetal bpp w/o non-stress · 34 acquisitions, 13 frames shown]
[im 2/34]
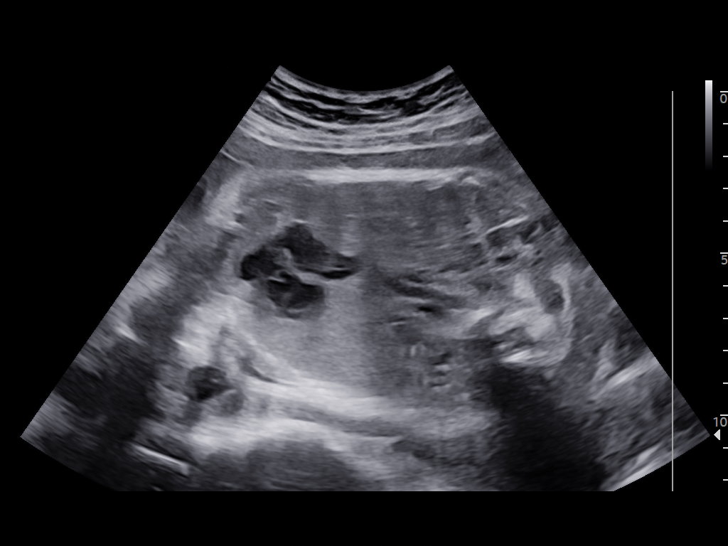
[im 4/34]
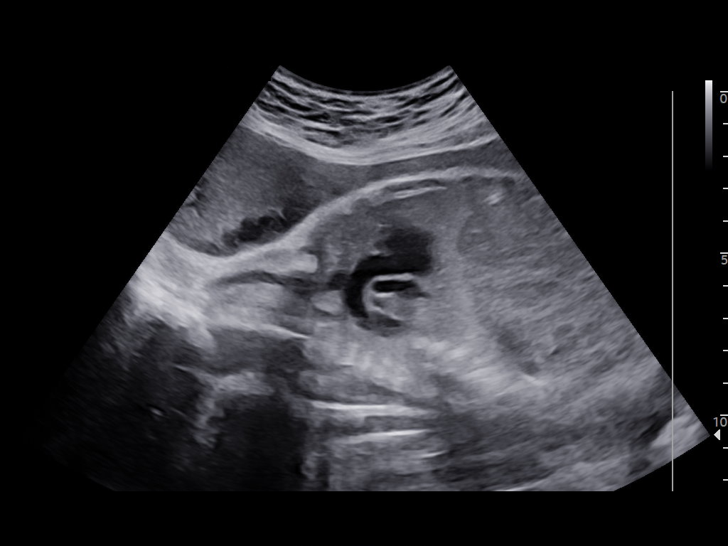
[im 7/34]
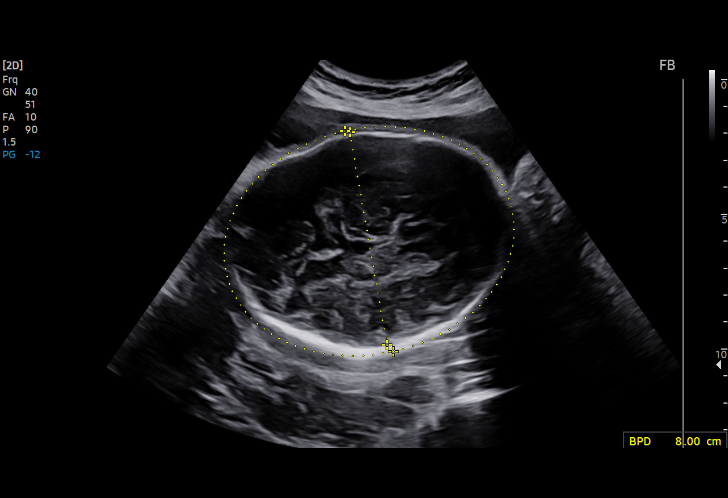
[im 9/34]
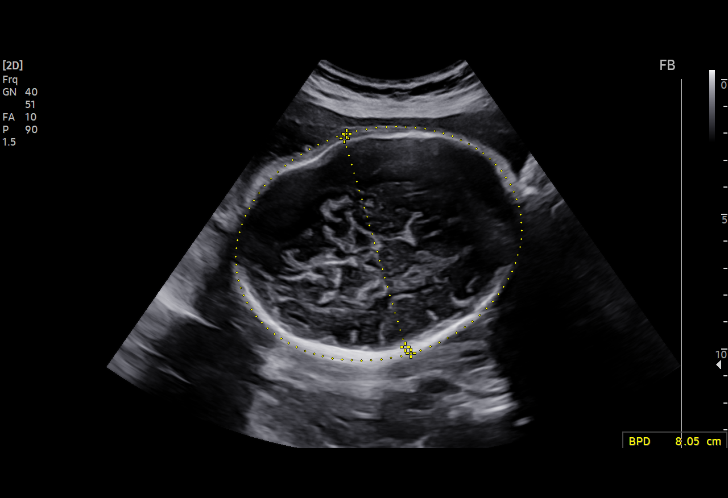
[im 12/34]
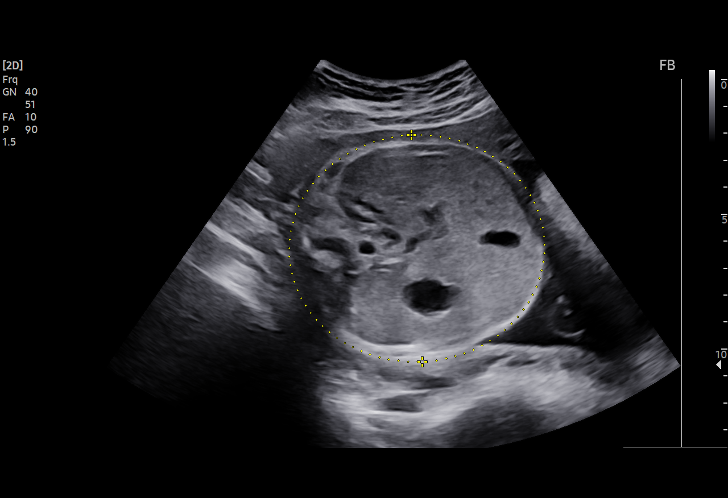
[im 14/34]
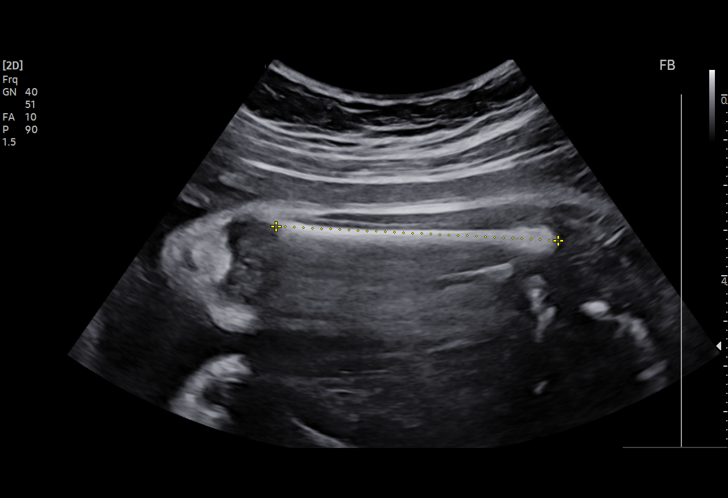
[im 18/34]
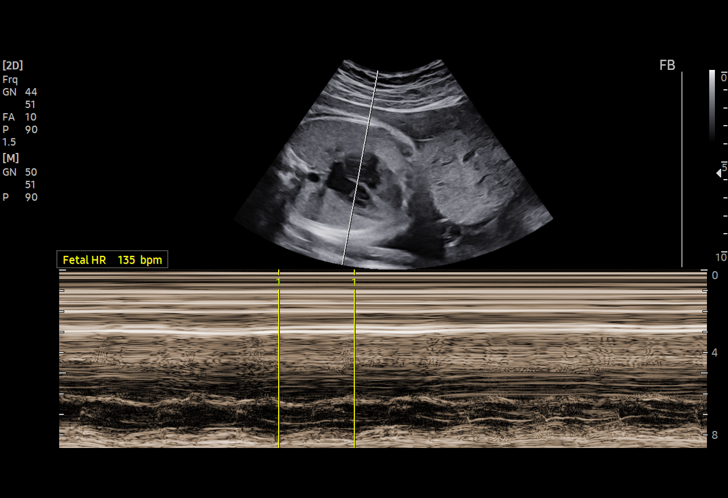
[im 20/34]
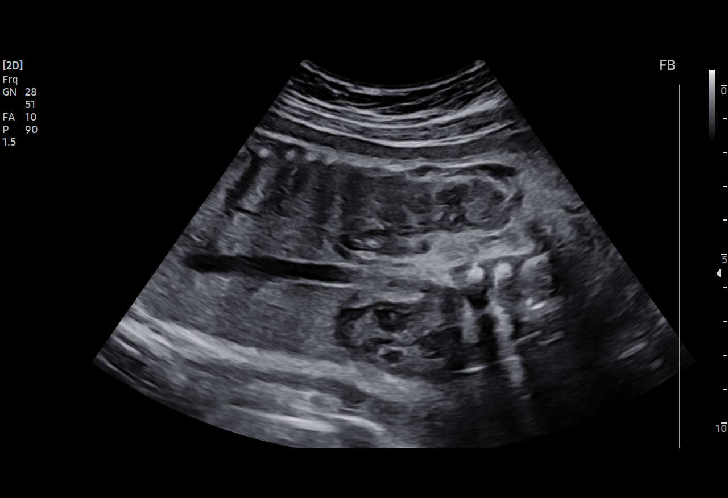
[im 23/34]
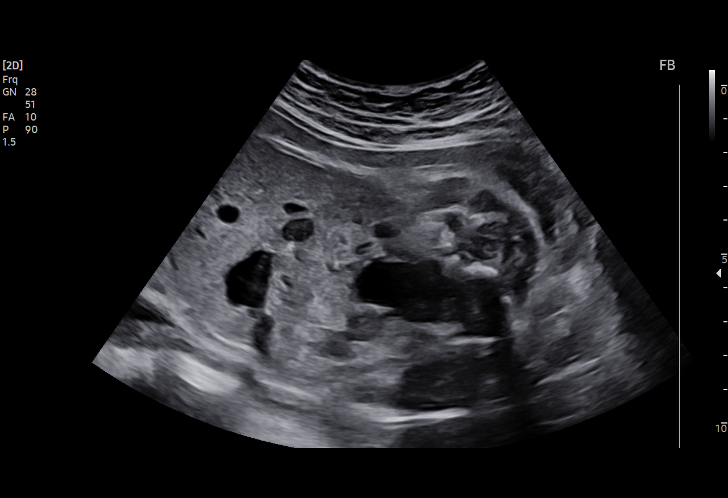
[im 25/34]
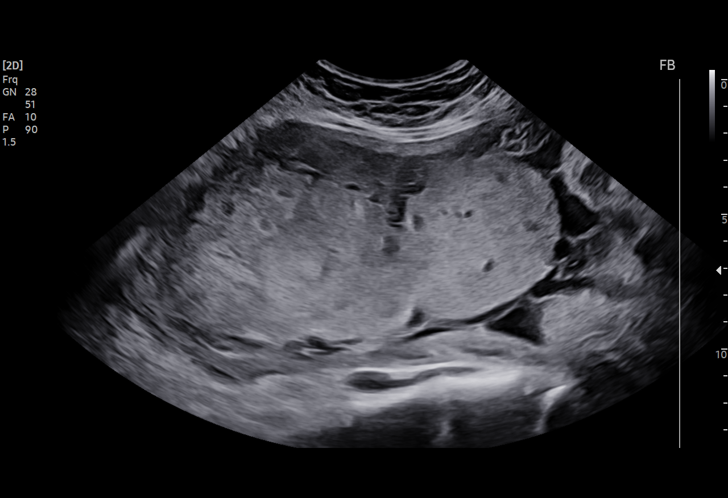
[im 27/34]
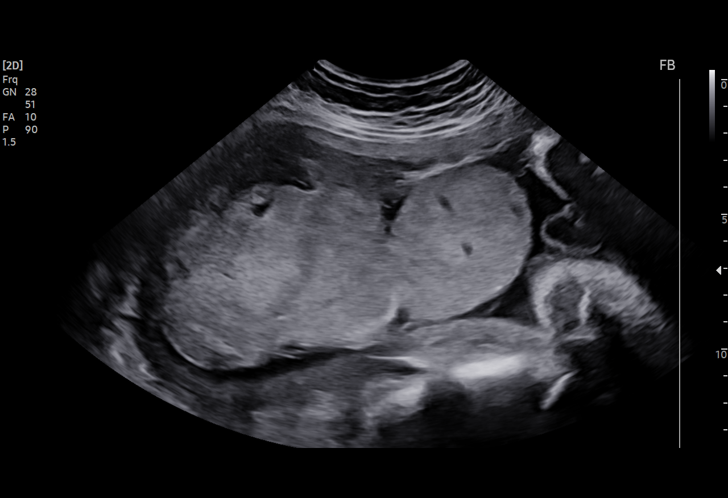
[im 30/34]
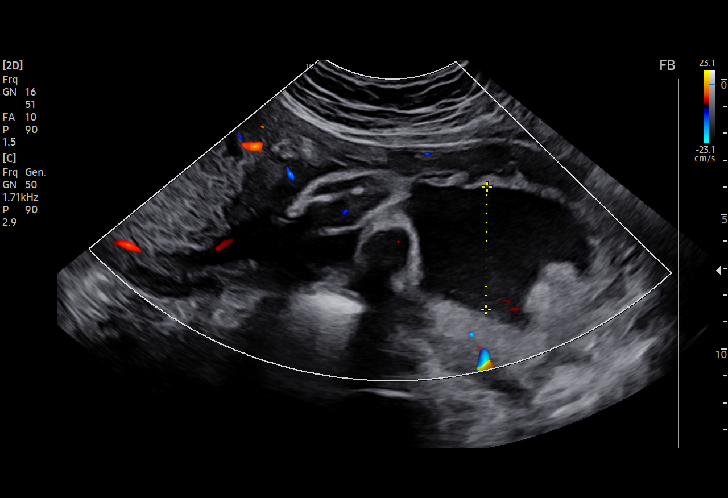
[im 32/34]
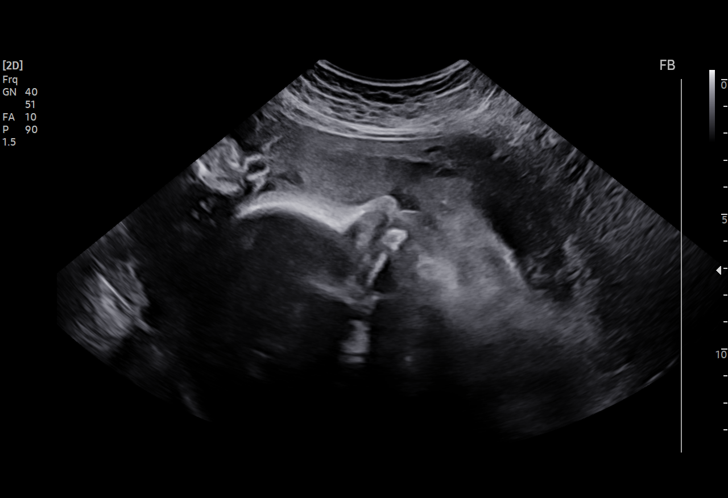

[13 of 28 positions shown; findings below may reference images not displayed]

[REDACTED]

Indications

 Gestational diabetes in pregnancy,
 controlled by oral hypoglycemic drugs
 Obesity complicating pregnancy, third
 trimester (BMI 31)
 32 weeks gestation of pregnancy
 LR NIPS/Negative AFP
Fetal Evaluation

 Num Of Fetuses:         1
 Fetal Heart Rate(bpm):  135
 Cardiac Activity:       Observed
 Presentation:           Breech
 Placenta:               Anterior
 P. Cord Insertion:      Previously Visualized

 Amniotic Fluid
 AFI FV:      Within normal limits

 AFI Sum(cm)     %Tile       Largest Pocket(cm)
 12.49           35

 RUQ(cm)       RLQ(cm)       LUQ(cm)        LLQ(cm)

Biophysical Evaluation

 Amniotic F.V:   Pocket => 2 cm             F. Tone:        Observed
 F. Movement:    Observed                   Score:          [DATE]
 F. Breathing:   Observed
Biometry

 BPD:      80.3  mm     G. Age:  32w 2d         49  %    CI:        71.01   %    70 - 86
                                                         FL/HC:      20.1   %    19.1 -
 HC:      303.6  mm     G. Age:  33w 5d         61  %    HC/AC:      1.08        0.96 -
 AC:      280.7  mm     G. Age:  32w 1d         52  %    FL/BPD:     76.0   %    71 - 87
 FL:         61  mm     G. Age:  31w 5d         28  %    FL/AC:      21.7   %    20 - 24

 Est. FW:    8884  gm      4 lb 4 oz     44  %
OB History

 Blood Type:   O+
 Gravidity:    4         Term:   2         SAB:   1
 Living:       2
Gestational Age

 LMP:           32w 0d        Date:  06/26/21                 EDD:   04/02/22
 U/S Today:     32w 3d                                        EDD:   03/30/22
 Best:          32w 0d     Det. By:  LMP  (06/26/21)          EDD:   04/02/22
Anatomy

 Cranium:               Appears normal         Aortic Arch:            Previously seen
 Cavum:                 Appears normal         Ductal Arch:            Previously seen
 Ventricles:            Previously seen        Diaphragm:              Previously seen
 Choroid Plexus:        Previously seen        Stomach:                Appears normal, left
                                                                       sided
 Cerebellum:            Previously seen        Abdomen:                Appears normal
 Posterior Fossa:       Previously seen        Abdominal Wall:         Previously seen
 Nuchal Fold:           Not applicable (>20    Cord Vessels:           Previously seen
                        wks GA)
 Face:                  Orbits and profile     Kidneys:                Appear normal
                        previously seen
 Lips:                  Previously seen        Bladder:                Appears normal
 Thoracic:              Appears normal         Spine:                  Previously seen
 Heart:                 Appears normal         Upper Extremities:      Previously seen
                        (4CH, axis, and
                        situs)
 RVOT:                  Previously seen        Lower Extremities:      Previously seen
 LVOT:                  Previously seen

 Other:  Male fetus. VC, 3VV and 3VTV, Nasal bone, lenses, maxilla,
         mandible and falx, Heels/feet and open hands/5th digits previously
         visualized.
Cervix Uterus Adnexa

 Adnexa
 No abnormality visualized.
Comments

 This patient was seen for a follow up growth scan due to
 gestational diabetes that is treated with metformin.
 The fetal growth and amniotic fluid level appears appropriate
 for her gestational age.
 A BPP performed today was [DATE].
 She will return in 1 week for another BPP
